# Patient Record
Sex: Female | Born: 1990 | Race: White | Hispanic: No | Marital: Married | Smoking: Never smoker
Health system: Southern US, Community
[De-identification: ages and names within clinical notes are randomized; demographics above are authoritative.]

## PROBLEM LIST (undated history)

## (undated) DIAGNOSIS — M545 Low back pain, unspecified: Secondary | ICD-10-CM

## (undated) DIAGNOSIS — E282 Polycystic ovarian syndrome: Secondary | ICD-10-CM

## (undated) DIAGNOSIS — F411 Generalized anxiety disorder: Secondary | ICD-10-CM

## (undated) HISTORY — DX: Generalized anxiety disorder: F41.1

## (undated) HISTORY — DX: Low back pain, unspecified: M54.50

## (undated) HISTORY — DX: Polycystic ovarian syndrome: E28.2

## (undated) HISTORY — DX: Low back pain: M54.5

---
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
## 2010-01-26 ENCOUNTER — Ambulatory Visit
Admission: RE | Admit: 2010-01-26 | Discharge: 2010-01-26 | Payer: Self-pay | Source: Home / Self Care | Attending: Internal Medicine | Admitting: Internal Medicine

## 2010-01-26 ENCOUNTER — Encounter: Payer: Self-pay | Admitting: Internal Medicine

## 2010-01-26 DIAGNOSIS — F411 Generalized anxiety disorder: Secondary | ICD-10-CM

## 2010-01-26 DIAGNOSIS — L738 Other specified follicular disorders: Secondary | ICD-10-CM | POA: Insufficient documentation

## 2010-01-26 HISTORY — DX: Generalized anxiety disorder: F41.1
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
## 2010-02-08 NOTE — Letter (Signed)
Summary: Out of Work  [facility]  [address] [place], [place] [address]   Phone: [phone]  Fax: [phone]    January 26, 2010   Employee:  ZI SEK    To Whom It May Concern:   For Medical reasons, please excuse the above named employee from work for the following dates:  01/26/2010  If you need additional information, please feel free to contact our office.         Sincerely,    Lamar Sprinkles, [facility]) for Dr Karie Schwalbe. Yetta Barre

## 2010-02-28 ENCOUNTER — Encounter: Payer: Self-pay | Admitting: Internal Medicine

## 2010-02-28 ENCOUNTER — Ambulatory Visit (INDEPENDENT_AMBULATORY_CARE_PROVIDER_SITE_OTHER): Payer: BC Managed Care – PPO | Admitting: Internal Medicine

## 2010-02-28 DIAGNOSIS — K5289 Other specified noninfective gastroenteritis and colitis: Secondary | ICD-10-CM
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
## 2010-03-06 NOTE — Letter (Signed)
Summary: Out of Work  [facility]  [address] [place], [place] [address]   Phone: [phone]  Fax: [phone]    February 28, 2010   Employee:  Barbara Bray    To Whom It May Concern:   For Medical reasons, please excuse the above named employee from work for the following dates:  Start:   02/28/10  End:   03/02/10  If you need additional information, please feel free to contact our office.         Sincerely,    Etta Grandchild MD

## 2010-05-13 ENCOUNTER — Emergency Department (HOSPITAL_COMMUNITY)
Admission: EM | Admit: 2010-05-13 | Discharge: 2010-05-13 | Disposition: A | Discharge status: Home / Self Care | Payer: BC Managed Care – PPO | Attending: Emergency Medicine | Admitting: Emergency Medicine

## 2010-05-13 DIAGNOSIS — J3489 Other specified disorders of nose and nasal sinuses: Secondary | ICD-10-CM | POA: Insufficient documentation

## 2010-05-13 DIAGNOSIS — H9209 Otalgia, unspecified ear: Secondary | ICD-10-CM | POA: Insufficient documentation

## 2010-05-21 ENCOUNTER — Ambulatory Visit (INDEPENDENT_AMBULATORY_CARE_PROVIDER_SITE_OTHER): Payer: BC Managed Care – PPO | Admitting: Internal Medicine

## 2010-05-21 ENCOUNTER — Encounter: Payer: Self-pay | Admitting: Internal Medicine

## 2010-05-21 VITALS — BP 100/70 | HR 114 | Temp 98.1°F | Ht 63.0 in

## 2010-05-21 DIAGNOSIS — H109 Unspecified conjunctivitis: Secondary | ICD-10-CM

## 2010-05-21 MED ORDER — POLYMYXIN B-TRIMETHOPRIM 10000-0.1 UNIT/ML-% OP SOLN: 1.0000 [drp] | OPHTHALMIC | Status: AC
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
## 2010-05-21 NOTE — Patient Instructions (Signed)
It was good to see you today. Polytrim antibiotics drops - Your prescription(s) have been submitted to your pharmacy. Please take as directed and contact our office if you believe you are having problem(s) with the medication(s). Wash bed sheets/pillow cases, and discard contacts or any eye makeup you have used since becoming ill with this eye irritation as discussed Followup as needed, call if symptoms worse
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
## 2010-05-25 NOTE — H&P (Signed)
Barbara Bray, Barbara Bray NO.:  [id number]   MEDICAL RECORD NO.:  [id number]                   PATIENT TYPE:  INP   LOCATION:  0103                                 FACILITY:  [facility]   PHYSICIAN:  Beverly Milch, MD                  DATE OF BIRTH:  [birth year]   DATE OF ADMISSION:  09/20/2003  DATE OF DISCHARGE:                         PSYCHIATRIC ADMISSION ASSESSMENT   IDENTIFICATION:  A 20 year old female, 8th grade student at [employer], is admitted emergently, involuntarily on a [facility] petition  for commitment in transfer from [facility]  Room for inpatient stabilization of suicide and homicidal risk.  The patient  was assessed by Dr. Jaci Lazier and determined to require inpatient  treatment and stabilization for safety.  Mother and the patient brought the  patient's journal containing writings about her 36 year old boyfriend Barbara Bray  as well as the patient having written a suicide note recently.  She states  she has a plan for suicide but will not disclose.   HISTORY OF PRESENT ILLNESS:  The patient is confusing, with some pressured  speech, as she reviews multiple contributing factors to her current  decompensation as well as her mounting psychological dysfunction over time.  The patient presents in a manic style, having expansive disregard for the  consequences of her relationship with a 78 year old man.  He has apparently  just been bailed out of jail.  She has a 32 year old friend, Barbara Bray, who  has been dating a middle-aged man as well, named Charles.  The patient seems  to have some identification  with Barbara Bray in this regard.  However the  patient's description of love for this 20 year old man is inherent to the  patient herself.  The patient has been gradually alienating herself from  mother and alienating mother somewhat from herself.  Parents are divorced  and the patient has had a  sister who attempted suicide twice in the past.  Paternal grandfather completed suicide.  The patient denies any sexual  activity.  She reports she is just in love with this 20 year old man and  that he will marry her when she turns age 77.  The patient reported to  mother she will have to be put away as she is suicidal over this man.  However she reported that she would not talk or participate in treatment on  arrival as though she did not maintain consistency with her statement that  she needed to be put away.  She has reported that she will kill anyone who  harms her 36 year old boyfriend.  The patient is expansive and grandiose  including in her threats.  Her mood is inappropriate to circumstance.  She  has no remorse and has significant denial for the consequences.  The patient  was in therapy at [facility] 5 to 7 months ago with Almond Lint, up until a  year ago.  The patient had a fear of sleeping alone, having to sleep in  mother's bed or out in the hall.  Her parents are divorced.  She describes  traumatic, chaotic, strange dreams that apparently contributed to not being  able to sleep alone.  However she does not acknowledge general anxiety or  specific anxiety.  She does not manifest social anxiety nor does she have  panic attacks.  She does not use alcohol or illicit drugs.  She denies  hallucinations or delusions.  She will not discuss mood swings or whether  she has had significant depression, although she has had episodic and  recurrent suicidal ideation.  She is post pubertal.  Her multiplicity of  symptoms raises questions for post-traumatic stress or oppositional-  defiance.  She may also have impulse control difficulties.   PAST MEDICAL HISTORY:  The patient had her last menses end of October 2004.  She denies sexual activity.  She suggests she has had 12 dental extractions  in the past.  She is not more specific about how or why.  She has no  medication allergies.  She  is on no medications at the time of admission.  She has no history of seizure or syncope.  She has no heart murmur or  arrythmia.   REVIEW OF SYSTEMS:  The patient denies any difficulty with gait, gaze or  continence.  She  denies exposure to communicable disease or toxins.  She  denies rash, jaundice or purpura.  There is no chest pain, palpitations, or  presyncope.  There is no abdominal pain, nausea, vomiting or diarrhea.  There is no dysuria or arthralgia.  Immunizations are up to date.   FAMILY HISTORY:  Parents are divorced.  The patient has a sister who  attempted suicide twice.  Paternal grandfather completed suicide.  A great  grandmother is supportive.  The patient has been progressively conflictual  with mother.   SOCIAL AND DEVELOPMENTAL HISTORY:  The patient is in the 8th grade at  [employer] and does well academically, making A's, B's and C's.  She denies any definite past sexual or physical maltreatment.  She denies  using cigarettes, alcohol, or illicit drugs.  However she manifests  Hysteroid denial as well as histrionic over elaboration regarding her  relationship with a 4 year old man.  She has his picture in her notebook  and wants that picture and notebook to have with her.  She acknowledges that  she is obsessed with him.  However she does not acknowledge sexual impulses  or obsessions.   ASSETS:  The patient seems intelligent.   MENTAL STATUS EXAM:  Height is 61 inches and weight is 112.5 pounds, with  blood pressure 106/70 with heart rate of 80 sitting and 103/70 with heart  rate of 112 standing.  The patient has an intact neurological exam.  She has  had no organic central nervous system trauma.  AMRs are intact and cranial  nerves are intact.  Speech is normal except pressured.  She is somewhat  labile in mood but overall expansive and euphoric.  She has no pathologic reflexes or soft neurologic findings.  Muscle strength and tone are normal.   There are no abnormal involuntary movements.  Gait and gaze are intact.  She  is expansive in her identifications and associations.  She describes  episodic dysphoria and suicidal ideation.  She has grandiose homicidal  ideation.  She is currently exhibiting moderate to severe euphoria,  particularly  for circumstances.  She is expansive and grandiose.  She is  able to participate in therapy, however judgment and insight are currently  poor.  She has no overt hallucinations or delusions, though question of  either shared delusions or grandiose delusions must be undertaken.  Dissociative symptoms are not definitely apparent but must be in the  differential diagnosis.  Suicidal ideation is more prominent than homicidal  ideation.   IMPRESSION:  AXIS 1:  1.  Mood disorder not otherwise specified, currently with manic symptoms.  2.  Identity disorder with hysteroid features.  3.  Rule out shared psychotic disorder (provisional diagnosis).  4.  Rule out post-traumatic stress disorder with reenactment symptoms      (provisional diagnosis).  5.  Rule out oppositional-defiant disorder (provisional diagnosis).  6.  Rule out impulse control disorder not otherwise specified (provisional      diagnosis).  AXIS II:  Diagnosis deferred.  AXIS III:  History of dental problems.  AXIS IV:  Stressors:  Family - severe, acute and chronic; peer relations - extreme,  acute; phase of life - extreme, acute.  AXIS V:  Global assessment of function of 40 with highest in last year 78.   PLAN:  The patient is admitted for inpatient adolescent psychiatric and  multi-disciplinary, multi-modal behavioral health treatment in a team-based  program in a locked psychiatric unit.  We will monitor mood, cognition and  activity for associated elaboration on dynamics and target symptoms  identification.  We will confront and clarify relations and past  connections, including with nightmares.  Psychoeducation, anger  management,  impulse control, behavioral containment, family therapy and parent  management training are planned, along with identity consolidation and  individuation and separation issues.  Cognitive behavioral therapy formats  are planned.  We will  consider Seroquel pharmacotherapy.  Estimated length of stay is 6-8 days  with target symptoms for discharge being stabilization of suicide and  homicidal risk, stabilization of mood, clarification of anxiety or  delusions, and generalization of the capacity for safe and effective  participation compliantly with outpatient care.                                               Beverly Milch, MD    GJ/MEDQ  D:  09/20/2003  T:  09/20/2003  Job:  [id number]

## 2010-05-31 ENCOUNTER — Ambulatory Visit: Payer: Self-pay | Admitting: Internal Medicine

## 2010-08-22 ENCOUNTER — Emergency Department (HOSPITAL_COMMUNITY)
Admission: EM | Admit: 2010-08-22 | Discharge: 2010-08-22 | Discharge status: Against Medical Advice | Payer: Self-pay | Attending: Emergency Medicine | Admitting: Emergency Medicine

## 2010-08-22 DIAGNOSIS — H5710 Ocular pain, unspecified eye: Secondary | ICD-10-CM | POA: Insufficient documentation

## 2011-01-29 ENCOUNTER — Other Ambulatory Visit: Payer: Self-pay | Admitting: Internal Medicine

## 2011-01-29 NOTE — Telephone Encounter (Signed)
Needs to be seen

## 2011-02-01 ENCOUNTER — Ambulatory Visit: Payer: Self-pay | Admitting: Internal Medicine

## 2011-02-01 DIAGNOSIS — Z0289 Encounter for other administrative examinations: Secondary | ICD-10-CM

## 2011-02-18 ENCOUNTER — Ambulatory Visit: Payer: Self-pay | Admitting: Internal Medicine

## 2011-02-18 DIAGNOSIS — Z0289 Encounter for other administrative examinations: Secondary | ICD-10-CM

## 2011-02-28 ENCOUNTER — Encounter (HOSPITAL_COMMUNITY): Payer: Self-pay | Admitting: Emergency Medicine

## 2011-02-28 ENCOUNTER — Emergency Department (HOSPITAL_COMMUNITY)
Admission: EM | Admit: 2011-02-28 | Discharge: 2011-02-28 | Disposition: A | Discharge status: Home / Self Care | Payer: BC Managed Care – PPO | Attending: Emergency Medicine | Admitting: Emergency Medicine

## 2011-02-28 DIAGNOSIS — M549 Dorsalgia, unspecified: Secondary | ICD-10-CM | POA: Insufficient documentation

## 2011-02-28 DIAGNOSIS — N949 Unspecified condition associated with female genital organs and menstrual cycle: Secondary | ICD-10-CM | POA: Insufficient documentation

## 2011-02-28 DIAGNOSIS — R319 Hematuria, unspecified: Secondary | ICD-10-CM | POA: Insufficient documentation

## 2011-02-28 DIAGNOSIS — R109 Unspecified abdominal pain: Secondary | ICD-10-CM | POA: Insufficient documentation

## 2011-02-28 DIAGNOSIS — N390 Urinary tract infection, site not specified: Secondary | ICD-10-CM | POA: Insufficient documentation

## 2011-02-28 DIAGNOSIS — R350 Frequency of micturition: Secondary | ICD-10-CM | POA: Insufficient documentation

## 2011-02-28 LAB — URINALYSIS, ROUTINE W REFLEX MICROSCOPIC
Glucose, UA: NEGATIVE mg/dL
Protein, ur: >300 mg/dL — AB
Urobilinogen, UA: 1 mg/dL (ref 0.0–1.0)

## 2011-02-28 LAB — WET PREP, GENITAL
WBC, Wet Prep HPF POC: NONE SEEN
Yeast Wet Prep HPF POC: NONE SEEN

## 2011-02-28 LAB — URINE MICROSCOPIC-ADD ON

## 2011-02-28 LAB — POCT PREGNANCY, URINE: Preg Test, Ur: NEGATIVE

## 2011-02-28 MED ORDER — PHENAZOPYRIDINE HCL 100 MG PO TABS
100.0000 mg | ORAL_TABLET | Freq: Three times a day (TID) | ORAL | Status: DC
  Administered 2011-02-28: 100 mg via ORAL

## 2011-02-28 MED ORDER — SULFAMETHOXAZOLE-TMP DS 800-160 MG PO TABS: 1.0000 | ORAL_TABLET | Freq: Two times a day (BID) | ORAL | Status: AC

## 2011-02-28 MED ORDER — SULFAMETHOXAZOLE-TMP DS 800-160 MG PO TABS
1.0000 | ORAL_TABLET | Freq: Once | ORAL | Status: AC
  Administered 2011-02-28: 1 via ORAL
  Filled 2011-02-28: qty 1

## 2011-02-28 MED ORDER — PHENAZOPYRIDINE HCL 200 MG PO TABS
ORAL_TABLET | ORAL | Status: AC
  Administered 2011-02-28: 100 mg via ORAL
  Filled 2011-02-28: qty 1

## 2011-02-28 MED ORDER — PHENAZOPYRIDINE HCL 100 MG PO TABS: 100.0000 mg | ORAL_TABLET | Freq: Three times a day (TID) | ORAL | Status: AC
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
## 2011-02-28 NOTE — Discharge Instructions (Signed)

## 2011-02-28 NOTE — ED Provider Notes (Signed)
Medical screening examination/treatment/procedure(s) were performed by non-physician practitioner and as supervising physician I was immediately available for consultation/collaboration.     Loren Racer, MD 02/28/11 [phone]

## 2011-02-28 NOTE — ED Notes (Signed)
Pt presented to the Er with c/o lower abd pain, intermittent, 6/10, RLQ and radiates to the lowe back, pt state that when urinating she is experiencing burning sensation, constant pain, 10/10

## 2011-03-01 LAB — GC/CHLAMYDIA PROBE AMP, GENITAL: Chlamydia, DNA Probe: NEGATIVE

## 2011-03-14 ENCOUNTER — Ambulatory Visit: Payer: Self-pay | Admitting: Internal Medicine

## 2011-03-18 ENCOUNTER — Ambulatory Visit: Payer: Self-pay | Admitting: Internal Medicine

## 2011-03-18 DIAGNOSIS — Z0289 Encounter for other administrative examinations: Secondary | ICD-10-CM

## 2011-04-11 ENCOUNTER — Encounter: Payer: Self-pay | Admitting: Internal Medicine

## 2011-04-11 ENCOUNTER — Ambulatory Visit (INDEPENDENT_AMBULATORY_CARE_PROVIDER_SITE_OTHER): Payer: BC Managed Care – PPO | Admitting: Internal Medicine

## 2011-04-11 VITALS — BP 110/72 | HR 81 | Temp 98.3°F | Resp 16 | Ht 63.0 in | Wt 124.0 lb

## 2011-04-11 DIAGNOSIS — J45909 Unspecified asthma, uncomplicated: Secondary | ICD-10-CM

## 2011-04-11 DIAGNOSIS — F411 Generalized anxiety disorder: Secondary | ICD-10-CM

## 2011-04-11 DIAGNOSIS — J4530 Mild persistent asthma, uncomplicated: Secondary | ICD-10-CM

## 2011-04-11 MED ORDER — MOMETASONE FURO-FORMOTEROL FUM 100-5 MCG/ACT IN AERO: 2.0000 | INHALATION_SPRAY | Freq: Two times a day (BID) | RESPIRATORY_TRACT | Status: DC

## 2011-04-11 MED ORDER — CLONAZEPAM 1 MG PO TABS: 1.0000 mg | ORAL_TABLET | Freq: Two times a day (BID) | ORAL | Status: DC | PRN

## 2011-04-11 MED ORDER — ALBUTEROL SULFATE HFA 108 (90 BASE) MCG/ACT IN AERS: 2.0000 | INHALATION_SPRAY | Freq: Four times a day (QID) | RESPIRATORY_TRACT | Status: DC | PRN

## 2011-04-11 MED ORDER — ESCITALOPRAM OXALATE 10 MG PO TABS: 10.0000 mg | ORAL_TABLET | Freq: Every day | ORAL | Status: DC

## 2011-04-11 NOTE — Progress Notes (Signed)
Subjective:    Patient ID: Barbara Bray, female    DOB: 08/20/1990, 21 y.o.   MRN: [id number]  Anxiety Presents for follow-up visit. Symptoms include excessive worry, hyperventilation, insomnia, nervous/anxious behavior and panic. Patient reports no chest pain, compulsions, confusion, decreased concentration, depressed mood, dizziness, dry mouth, feeling of choking, irritability, malaise, muscle tension, nausea, obsessions, palpitations, restlessness, shortness of breath or suicidal ideas. Symptoms occur occasionally. The severity of symptoms is mild. The quality of sleep is fair. Nighttime awakenings: occasional.   Her past medical history is significant for asthma. Compliance with medications is 0-25%.  Asthma She complains of wheezing. There is no chest tightness, cough, difficulty breathing, frequent throat clearing, hemoptysis, hoarse voice, shortness of breath or sputum production. This is a recurrent problem. The current episode started more than 1 year ago. The problem occurs intermittently. The problem has been unchanged. Pertinent negatives include no appetite change, chest pain, dyspnea on exertion, ear congestion, ear pain, fever, headaches, heartburn, malaise/fatigue, myalgias, nasal congestion, orthopnea, PND, postnasal drip, rhinorrhea, sneezing, sore throat, sweats, trouble swallowing or weight loss. Her symptoms are aggravated by change in weather. Her symptoms are alleviated by beta-agonist. She reports minimal improvement on treatment. Her past medical history is significant for asthma.      Review of Systems  Constitutional: Negative for fever, weight loss, malaise/fatigue, diaphoresis, activity change, appetite change, irritability, fatigue and unexpected weight change.  HENT: Negative.  Negative for ear pain, sore throat, hoarse voice, rhinorrhea, sneezing, trouble swallowing and postnasal drip.   Eyes: Negative.   Respiratory: Positive for wheezing. Negative for apnea, cough,  hemoptysis, sputum production, choking, chest tightness, shortness of breath and stridor.   Cardiovascular: Negative for chest pain, dyspnea on exertion, palpitations, leg swelling and PND.  Gastrointestinal: Negative for heartburn, nausea, vomiting, abdominal pain, diarrhea, constipation, blood in stool, abdominal distention, anal bleeding and rectal pain.  Genitourinary: Negative for dysuria, urgency, frequency, hematuria, flank pain, decreased urine volume, enuresis, difficulty urinating and dyspareunia.  Musculoskeletal: Negative for myalgias, back pain, joint swelling, arthralgias and gait problem.  Skin: Negative for color change, pallor, rash and wound.  Neurological: Negative for dizziness, tremors, seizures, syncope, facial asymmetry, speech difficulty, weakness, light-headedness, numbness and headaches.  Hematological: Negative for adenopathy. Does not bruise/bleed easily.  Psychiatric/Behavioral: Negative for suicidal ideas, confusion and decreased concentration. The patient is nervous/anxious and has insomnia.        Objective:   Physical Exam  Vitals reviewed. Constitutional: She is oriented to person, place, and time. She appears well-developed and well-nourished. No distress.  HENT:  Head: Normocephalic and atraumatic.  Mouth/Throat: Oropharynx is clear and moist. No oropharyngeal exudate.  Eyes: Conjunctivae are normal. Right eye exhibits no discharge. Left eye exhibits no discharge. No scleral icterus.  Neck: Normal range of motion. Neck supple. No JVD present. No tracheal deviation present. No thyromegaly present.  Cardiovascular: Normal rate, regular rhythm, normal heart sounds and intact distal pulses.  Exam reveals no gallop and no friction rub.   No murmur heard. Pulmonary/Chest: Effort normal and breath sounds normal. No stridor. No respiratory distress. She has no wheezes. She has no rales. She exhibits no tenderness.  Abdominal: Soft. Bowel sounds are normal. She  exhibits no distension and no mass. There is no tenderness. There is no rebound and no guarding.  Musculoskeletal: Normal range of motion. She exhibits no edema and no tenderness.  Lymphadenopathy:    She has no cervical adenopathy.  Neurological: She is oriented to person, place, and time.  Skin: Skin is warm and dry. No rash noted. She is not diaphoretic. No erythema. No pallor.  Psychiatric: She has a normal mood and affect. Her behavior is normal. Judgment and thought content normal.          Assessment & Plan:

## 2011-04-11 NOTE — Assessment & Plan Note (Signed)
She will restart lexapro and will use klonopin as needed

## 2011-04-11 NOTE — Patient Instructions (Signed)
Asthma, Adult Asthma is caused by narrowing of the air passages in the lungs. It may be triggered by pollen, dust, animal dander, molds, some foods, respiratory infections, exposure to smoke, exercise, emotional stress or other allergens (things that cause allergic reactions or allergies). Repeat attacks are common. HOME CARE INSTRUCTIONS   Use prescription medications as ordered by your caregiver.   Avoid pollen, dust, animal dander, molds, smoke and other things that cause attacks at home and at work.   You may have fewer attacks if you decrease dust in your home. Electrostatic air cleaners may help.   It may help to replace your pillows or mattress with materials less likely to cause allergies.   Talk to your caregiver about an action plan for managing asthma attacks at home, including, the use of a peak flow meter which measures the severity of your asthma attack. An action plan can help minimize or stop the attack without having to seek medical care.   If you are not on a fluid restriction, drink 8 to 10 glasses of water each day.   Always have a plan prepared for seeking medical attention, including, calling your physician, accessing local emergency care, and calling 911 (in the [place] for a severe attack.   Discuss possible exercise routines with your caregiver.   If animal dander is the cause of asthma, you may need to get rid of pets.  SEEK MEDICAL CARE IF:   You have wheezing and shortness of breath even if taking medicine to prevent attacks.   You have muscle aches, chest pain or thickening of sputum.   Your sputum changes from clear or white to yellow, green, gray, or bloody.   You have any problems that may be related to the medicine you are taking (such as a rash, itching, swelling or trouble breathing).  SEEK IMMEDIATE MEDICAL CARE IF:   Your usual medicines do not stop your wheezing or there is increased coughing and/or shortness of breath.   You have increased  difficulty breathing.   You have a fever.  MAKE SURE YOU:   Understand these instructions.   Will watch your condition.   Will get help right away if you are not doing well or get worse.  Document Released: 12/24/2004 Document Revised: 12/13/2010 Document Reviewed: 08/12/2007 [facility] Patient Information 2012 [place], [place].Anxiety and Panic Attacks Your caregiver has informed you that you are having an anxiety or panic attack. There may be many forms of this. Most of the time these attacks come suddenly and without warning. They come at any time of day, including periods of sleep, and at any time of life. They may be strong and unexplained. Although panic attacks are very scary, they are physically harmless. Sometimes the cause of your anxiety is not known. Anxiety is a protective mechanism of the body in its fight or flight mechanism. Most of these perceived danger situations are actually nonphysical situations (such as anxiety over losing a job). CAUSES  The causes of an anxiety or panic attack are many. Panic attacks may occur in otherwise healthy people given a certain set of circumstances. There may be a genetic cause for panic attacks. Some medications may also have anxiety as a side effect. SYMPTOMS  Some of the most common feelings are:  Intense terror.   Dizziness, feeling faint.   Hot and cold flashes.   Fear of going crazy.   Feelings that nothing is real.   Sweating.   Shaking.   Chest pain or  a fast heartbeat (palpitations).   Smothering, choking sensations.   Feelings of impending doom and that death is near.   Tingling of extremities, this may be from over-breathing.   Altered reality (derealization).   Being detached from yourself (depersonalization).  Several symptoms can be present to make up anxiety or panic attacks. DIAGNOSIS  The evaluation by your caregiver will depend on the type of symptoms you are experiencing. The diagnosis of anxiety or panic  attack is made when no physical illness can be determined to be a cause of the symptoms. TREATMENT  Treatment to prevent anxiety and panic attacks may include:  Avoidance of circumstances that cause anxiety.   Reassurance and relaxation.   Regular exercise.   Relaxation therapies, such as yoga.   Psychotherapy with a psychiatrist or therapist.   Avoidance of caffeine, alcohol and illegal drugs.   Prescribed medication.  SEEK IMMEDIATE MEDICAL CARE IF:   You experience panic attack symptoms that are different than your usual symptoms.   You have any worsening or concerning symptoms.  Document Released: 12/24/2004 Document Revised: 12/13/2010 Document Reviewed: 04/27/2009 [facility] Patient Information 2012 [place], [place].

## 2011-04-11 NOTE — Assessment & Plan Note (Signed)
I think she needs to add on an ICS so I started her on Dulera, she can continue with albuterol as needed

## 2011-04-12 ENCOUNTER — Ambulatory Visit (INDEPENDENT_AMBULATORY_CARE_PROVIDER_SITE_OTHER): Payer: BC Managed Care – PPO

## 2011-04-12 DIAGNOSIS — Z23 Encounter for immunization: Secondary | ICD-10-CM

## 2011-05-01 ENCOUNTER — Ambulatory Visit (INDEPENDENT_AMBULATORY_CARE_PROVIDER_SITE_OTHER): Payer: BC Managed Care – PPO | Admitting: Internal Medicine

## 2011-05-01 ENCOUNTER — Encounter: Payer: Self-pay | Admitting: Internal Medicine

## 2011-05-01 ENCOUNTER — Other Ambulatory Visit (INDEPENDENT_AMBULATORY_CARE_PROVIDER_SITE_OTHER): Payer: BC Managed Care – PPO

## 2011-05-01 VITALS — BP 114/84 | HR 96 | Temp 98.1°F | Resp 16 | Wt 122.5 lb

## 2011-05-01 DIAGNOSIS — N390 Urinary tract infection, site not specified: Secondary | ICD-10-CM

## 2011-05-01 LAB — URINALYSIS, ROUTINE W REFLEX MICROSCOPIC
Nitrite: POSITIVE
Specific Gravity, Urine: 1.02 (ref 1.000–1.030)
Total Protein, Urine: NEGATIVE
Urobilinogen, UA: 0.2 (ref 0.0–1.0)

## 2011-05-01 LAB — POCT URINALYSIS DIPSTICK
Bilirubin, UA: NEGATIVE
Glucose, UA: NEGATIVE
Nitrite, UA: NEGATIVE
Spec Grav, UA: <=1.005
Urobilinogen, UA: 0.2

## 2011-05-01 MED ORDER — NITROFURANTOIN MONOHYD MACRO 100 MG PO CAPS: 100.0000 mg | ORAL_CAPSULE | Freq: Two times a day (BID) | ORAL | Status: AC

## 2011-05-01 MED ORDER — PHENAZOPYRIDINE HCL 200 MG PO TABS: 200.0000 mg | ORAL_TABLET | Freq: Three times a day (TID) | ORAL | Status: AC | PRN

## 2011-05-01 NOTE — Assessment & Plan Note (Signed)
Start macrobid and pyridium and check her urine culture today

## 2011-05-01 NOTE — Patient Instructions (Signed)

## 2011-05-01 NOTE — Progress Notes (Signed)
  Subjective:    Patient ID: Barbara Bray, female    DOB: [birth year], 21 y.o.   MRN: [id number]  Dysuria  This is a new problem. The current episode started yesterday. The problem occurs every urination. The problem has been gradually worsening. The quality of the pain is described as burning. The pain is at a severity of 0/10. The patient is experiencing no pain. There has been no fever. The fever has been present for less than 1 day. She is sexually active. There is no history of pyelonephritis. Associated symptoms include frequency, hematuria and urgency. Pertinent negatives include no chills, discharge, flank pain, hesitancy, nausea, possible pregnancy, sweats or vomiting. She has tried nothing for the symptoms. Her past medical history is significant for recurrent UTIs.      Review of Systems  Constitutional: Negative for fever, chills, diaphoresis, activity change, appetite change, fatigue and unexpected weight change.  HENT: Negative.   Eyes: Negative.   Respiratory: Negative for cough, chest tightness, shortness of breath, wheezing and stridor.   Cardiovascular: Negative for chest pain, palpitations and leg swelling.  Gastrointestinal: Negative for nausea, vomiting, abdominal pain, diarrhea and blood in stool.  Genitourinary: Positive for dysuria, urgency, frequency and hematuria. Negative for hesitancy, flank pain, decreased urine volume, vaginal bleeding, vaginal discharge, enuresis, difficulty urinating, genital sores, vaginal pain, menstrual problem, pelvic pain and dyspareunia.  Musculoskeletal: Negative for myalgias, back pain, joint swelling, arthralgias and gait problem.  Skin: Negative for color change, pallor, rash and wound.  Neurological: Negative.   Hematological: Negative for adenopathy. Does not bruise/bleed easily.  Psychiatric/Behavioral: Negative.        Objective:   Physical Exam  Vitals reviewed. Constitutional: She is oriented to person, place, and time. She  appears well-developed and well-nourished. No distress.  HENT:  Head: Normocephalic and atraumatic.  Mouth/Throat: Oropharynx is clear and moist. No oropharyngeal exudate.  Eyes: Conjunctivae are normal. Right eye exhibits no discharge. Left eye exhibits no discharge. No scleral icterus.  Neck: Normal range of motion. Neck supple. No JVD present. No tracheal deviation present. No thyromegaly present.  Cardiovascular: Normal rate, regular rhythm, normal heart sounds and intact distal pulses.  Exam reveals no gallop and no friction rub.   No murmur heard. Pulmonary/Chest: Effort normal and breath sounds normal. No stridor. No respiratory distress. She has no wheezes. She has no rales. She exhibits no tenderness.  Abdominal: Soft. Normal appearance and bowel sounds are normal. She exhibits no shifting dullness, no distension, no pulsatile liver, no fluid wave, no abdominal bruit, no ascites, no pulsatile midline mass and no mass. There is no hepatosplenomegaly. There is no tenderness. There is no rigidity, no rebound, no guarding, no CVA tenderness, no tenderness at McBurney's point and negative Murphy's sign.  Musculoskeletal: Normal range of motion. She exhibits no edema and no tenderness.  Lymphadenopathy:    She has no cervical adenopathy.  Neurological: She is oriented to person, place, and time.  Skin: Skin is warm and dry. No rash noted. She is not diaphoretic. No erythema. No pallor.  Psychiatric: She has a normal mood and affect. Her behavior is normal. Judgment and thought content normal.          Assessment & Plan:

## 2011-05-02 ENCOUNTER — Encounter: Payer: Self-pay | Admitting: Internal Medicine

## 2011-05-28 ENCOUNTER — Ambulatory Visit: Payer: Self-pay | Admitting: Internal Medicine

## 2011-05-28 DIAGNOSIS — Z0289 Encounter for other administrative examinations: Secondary | ICD-10-CM

## 2011-06-27 ENCOUNTER — Ambulatory Visit: Payer: Self-pay | Admitting: Internal Medicine

## 2011-06-27 DIAGNOSIS — Z0289 Encounter for other administrative examinations: Secondary | ICD-10-CM

## 2011-07-24 ENCOUNTER — Ambulatory Visit (INDEPENDENT_AMBULATORY_CARE_PROVIDER_SITE_OTHER): Payer: BC Managed Care – PPO | Admitting: Internal Medicine

## 2011-07-24 ENCOUNTER — Encounter: Payer: Self-pay | Admitting: Internal Medicine

## 2011-07-24 VITALS — BP 116/80 | HR 88 | Temp 98.1°F | Resp 16 | Wt 126.0 lb

## 2011-07-24 DIAGNOSIS — Z23 Encounter for immunization: Secondary | ICD-10-CM

## 2011-07-24 DIAGNOSIS — F411 Generalized anxiety disorder: Secondary | ICD-10-CM

## 2011-07-24 MED ORDER — CLONAZEPAM 1 MG PO TABS: 1.0000 mg | ORAL_TABLET | Freq: Two times a day (BID) | ORAL | Status: DC | PRN

## 2011-07-24 NOTE — Assessment & Plan Note (Signed)
She is doing well on the current meds and will continue them

## 2011-07-24 NOTE — Patient Instructions (Signed)

## 2011-07-24 NOTE — Progress Notes (Signed)
  Subjective:    Patient ID: Barbara Bray, female    DOB: [birth year], 20 y.o.   MRN: [id number]  Anxiety Presents for follow-up visit. Symptoms include insomnia, irritability and nervous/anxious behavior. Patient reports no chest pain, compulsions, confusion, decreased concentration, depressed mood, dizziness, dry mouth, excessive worry, feeling of choking, hyperventilation, malaise, muscle tension, nausea, obsessions, palpitations, panic, restlessness, shortness of breath or suicidal ideas. Symptoms occur most days. The severity of symptoms is mild. The quality of sleep is fair. Nighttime awakenings: occasional.   Compliance with medications is 76-100%.      Review of Systems  Constitutional: Positive for irritability.  HENT: Negative.   Eyes: Negative.   Respiratory: Negative for chest tightness, shortness of breath, wheezing and stridor.   Cardiovascular: Negative.  Negative for chest pain and palpitations.  Gastrointestinal: Negative.  Negative for nausea, vomiting, abdominal pain, diarrhea, constipation and blood in stool.  Genitourinary: Negative for dysuria, urgency, frequency, hematuria, flank pain, decreased urine volume, enuresis, difficulty urinating and dyspareunia.  Musculoskeletal: Negative.   Skin: Negative.   Neurological: Negative.  Negative for dizziness.  Hematological: Negative for adenopathy. Does not bruise/bleed easily.  Psychiatric/Behavioral: Negative for suicidal ideas, hallucinations, behavioral problems, confusion, disturbed wake/sleep cycle, self-injury, dysphoric mood, decreased concentration and agitation. The patient is nervous/anxious and has insomnia. The patient is not hyperactive.        Objective:   Physical Exam  Vitals reviewed. Constitutional: She is oriented to person, place, and time. She appears well-developed and well-nourished. No distress.  HENT:  Head: Normocephalic and atraumatic.  Mouth/Throat: Oropharynx is clear and moist. No  oropharyngeal exudate.  Eyes: Conjunctivae are normal. Right eye exhibits no discharge. Left eye exhibits no discharge. No scleral icterus.  Neck: Normal range of motion. Neck supple. No JVD present. No tracheal deviation present. No thyromegaly present.  Cardiovascular: Normal rate, regular rhythm, normal heart sounds and intact distal pulses.  Exam reveals no gallop and no friction rub.   No murmur heard. Pulmonary/Chest: Effort normal and breath sounds normal. No stridor. No respiratory distress. She has no wheezes. She has no rales. She exhibits no tenderness.  Abdominal: Soft. Bowel sounds are normal. She exhibits no distension and no mass. There is no tenderness. There is no rebound and no guarding.  Musculoskeletal: Normal range of motion. She exhibits no edema and no tenderness.  Lymphadenopathy:    She has no cervical adenopathy.  Neurological: She is oriented to person, place, and time.  Skin: Skin is warm and dry. No rash noted. She is not diaphoretic. No erythema. No pallor.  Psychiatric: She has a normal mood and affect. Her behavior is normal. Judgment and thought content normal.          Assessment & Plan:

## 2011-07-26 LAB — TB SKIN TEST
Induration: 0 mm
TB Skin Test: NEGATIVE

## 2011-09-10 ENCOUNTER — Ambulatory Visit (INDEPENDENT_AMBULATORY_CARE_PROVIDER_SITE_OTHER): Payer: BC Managed Care – PPO | Admitting: Endocrinology

## 2011-09-10 ENCOUNTER — Encounter: Payer: Self-pay | Admitting: Endocrinology

## 2011-09-10 VITALS — BP 112/80 | HR 93 | Temp 98.1°F | Resp 14 | Wt 127.5 lb

## 2011-09-10 DIAGNOSIS — L409 Psoriasis, unspecified: Secondary | ICD-10-CM

## 2011-09-10 DIAGNOSIS — L408 Other psoriasis: Secondary | ICD-10-CM

## 2011-09-10 MED ORDER — HYDROCORTISONE 2.5 % EX LOTN: TOPICAL_LOTION | Freq: Three times a day (TID) | CUTANEOUS | Status: DC

## 2011-09-10 MED ORDER — CEFUROXIME AXETIL 250 MG PO TABS: 250.0000 mg | ORAL_TABLET | Freq: Two times a day (BID) | ORAL | Status: DC

## 2011-09-10 MED ORDER — TRAMADOL HCL 50 MG PO TABS: 50.0000 mg | ORAL_TABLET | Freq: Four times a day (QID) | ORAL | Status: DC | PRN
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
## 2011-09-10 NOTE — Patient Instructions (Addendum)
i have sent 3 prescriptions to your pharmacy; for an antibiotic, skin lotion, and cough medication. I hope you feel better soon.  If you don't feel better by next week, please call back. Refer to a dermatology specialist.  you will receive a phone call, about a day and time for an appointment

## 2011-09-11 ENCOUNTER — Telehealth: Payer: Self-pay | Admitting: Internal Medicine

## 2011-09-11 MED ORDER — HYDROCODONE-HOMATROPINE 5-1.5 MG/5ML PO SYRP: 5.0000 mL | ORAL_SOLUTION | Freq: Three times a day (TID) | ORAL | Status: AC | PRN

## 2011-09-11 NOTE — Telephone Encounter (Signed)
Please call into [facility] on [address]

## 2011-09-11 NOTE — Telephone Encounter (Signed)
Pt saw Dr Everardo All yesterday and states that she was prescribed tramadol, request something else be called in. meds gave her a stomach ache and made her ill. Did not help cough

## 2011-09-16 ENCOUNTER — Encounter: Payer: Self-pay | Admitting: Internal Medicine

## 2011-09-16 ENCOUNTER — Ambulatory Visit (INDEPENDENT_AMBULATORY_CARE_PROVIDER_SITE_OTHER): Payer: BC Managed Care – PPO | Admitting: Internal Medicine

## 2011-09-16 ENCOUNTER — Encounter: Payer: Self-pay | Admitting: Endocrinology

## 2011-09-16 ENCOUNTER — Ambulatory Visit (INDEPENDENT_AMBULATORY_CARE_PROVIDER_SITE_OTHER)
Admission: RE | Admit: 2011-09-16 | Discharge: 2011-09-16 | Disposition: A | Discharge status: Home / Self Care | Payer: BC Managed Care – PPO | Source: Ambulatory Visit | Attending: Internal Medicine | Admitting: Internal Medicine

## 2011-09-16 VITALS — BP 108/80 | HR 88 | Temp 97.7°F | Resp 16 | Wt 132.0 lb

## 2011-09-16 DIAGNOSIS — J45909 Unspecified asthma, uncomplicated: Secondary | ICD-10-CM

## 2011-09-16 DIAGNOSIS — R05 Cough: Secondary | ICD-10-CM

## 2011-09-16 DIAGNOSIS — J4530 Mild persistent asthma, uncomplicated: Secondary | ICD-10-CM

## 2011-09-16 DIAGNOSIS — J209 Acute bronchitis, unspecified: Secondary | ICD-10-CM

## 2011-09-16 DIAGNOSIS — R059 Cough, unspecified: Secondary | ICD-10-CM

## 2011-09-16 MED ORDER — METHYLPREDNISOLONE 4 MG PO KIT: PACK | ORAL | Status: AC

## 2011-09-16 MED ORDER — HYDROCOD POLST-CPM POLST ER 10-8 MG PO CP12: 1.0000 | ORAL_CAPSULE | Freq: Two times a day (BID) | ORAL | Status: DC | PRN

## 2011-09-16 MED ORDER — AZITHROMYCIN 500 MG PO TABS: 500.0000 mg | ORAL_TABLET | Freq: Every day | ORAL | Status: AC

## 2011-09-16 NOTE — Patient Instructions (Signed)

## 2011-09-16 NOTE — Progress Notes (Signed)
  Subjective:    Patient ID: Barbara Bray, female    DOB: [birth year], 21 y.o.   MRN: [id number]  Cough This is a recurrent problem. The current episode started 1 to 4 weeks ago. The problem has been unchanged. The problem occurs every few hours. The cough is non-productive. Associated symptoms include rhinorrhea and wheezing. Pertinent negatives include no chest pain, chills, ear congestion, ear pain, fever, headaches, heartburn, hemoptysis, myalgias, nasal congestion, postnasal drip, rash, sore throat, shortness of breath, sweats or weight loss. She has tried OTC cough suppressant, prescription cough suppressant, steroid inhaler and a beta-agonist inhaler (ceftin, tramadol, hycodan) for the symptoms. Her past medical history is significant for asthma.      Review of Systems  Constitutional: Negative for fever, chills, weight loss, diaphoresis, activity change, appetite change, fatigue and unexpected weight change.  HENT: Positive for rhinorrhea. Negative for ear pain, nosebleeds, congestion, sore throat, sneezing, voice change and postnasal drip.   Eyes: Negative.   Respiratory: Positive for cough and wheezing. Negative for apnea, hemoptysis, choking, chest tightness, shortness of breath and stridor.   Cardiovascular: Negative for chest pain, palpitations and leg swelling.  Gastrointestinal: Negative.  Negative for heartburn.  Genitourinary: Negative.   Musculoskeletal: Negative for myalgias, back pain, joint swelling, arthralgias and gait problem.  Skin: Negative for color change, pallor, rash and wound.  Neurological: Negative.  Negative for headaches.  Hematological: Negative for adenopathy. Does not bruise/bleed easily.  Psychiatric/Behavioral: Negative.        Objective:   Physical Exam  Vitals reviewed. Constitutional: She is oriented to person, place, and time. She appears well-developed and well-nourished.  Non-toxic appearance. She does not have a sickly appearance. She does not  appear ill. No distress.  HENT:  Head: Normocephalic and atraumatic.  Mouth/Throat: Oropharynx is clear and moist. No oropharyngeal exudate.  Eyes: Conjunctivae are normal. Right eye exhibits no discharge. Left eye exhibits no discharge. No scleral icterus.  Neck: Normal range of motion. Neck supple. No JVD present. No tracheal deviation present. No thyromegaly present.  Cardiovascular: Normal rate, regular rhythm, normal heart sounds and intact distal pulses.  Exam reveals no gallop and no friction rub.   No murmur heard. Pulmonary/Chest: Effort normal and breath sounds normal. No stridor. No respiratory distress. She has no wheezes. She has no rales. She exhibits no tenderness.  Abdominal: Soft. Bowel sounds are normal. She exhibits no distension and no mass. There is no tenderness. There is no rebound and no guarding.  Musculoskeletal: Normal range of motion. She exhibits no edema and no tenderness.  Lymphadenopathy:    She has no cervical adenopathy.  Neurological: She is oriented to person, place, and time.  Skin: Skin is warm and dry. No rash noted. She is not diaphoretic. No erythema. No pallor.  Psychiatric: She has a normal mood and affect. Her behavior is normal. Judgment and thought content normal.          Assessment & Plan:

## 2011-09-16 NOTE — Assessment & Plan Note (Signed)
I will check her CXR to look for pna, mass, edema 

## 2011-09-16 NOTE — Assessment & Plan Note (Signed)
I will change her antibiotic to zpak to cover aytpicals and will try to control her cough with tussicaps

## 2011-09-16 NOTE — Assessment & Plan Note (Signed)
Will start a medrol dose pak to treat the flare of wheezing

## 2011-09-20 ENCOUNTER — Telehealth: Payer: Self-pay | Admitting: Internal Medicine

## 2011-09-20 NOTE — Telephone Encounter (Signed)
Caller: Barbara Bray/Patient; PCP: Sanda Linger (Adults only); CB#: [phone]; Call regarding Question About Whooping Cough, States she  Had A. Chest X-Ray That Came Back Normal, Wants To Know If There Is A. Change She Could Still Have Whooping Cough.; Caller states her sister just had a new baby and  is asking if she could still have whooping cough even with a normal chest xray  and be contagious.  Caller states she was in line at pharmacy when Dr. Yetta Barre called to tell her that her chest xray was normal and she did not think to ask any questions then.  Reviewed EPIC and chart note shows Dr. Yetta Barre is treating her for Acute Bronchitis, Cough and Asthma.  DISCUSSED WITH CALLER WILL SEND NOTE TO DR. Yetta Barre TO MAKE SURE HE IS NOT CONCERNED PATIENT MAY STILL HAVE WHOOPING COUGH WITH NORMAL XRAY.  Caller states she completed her three days worth of Azithromycin on 09/19/11 and has not noticed much change in cough at this time.  Discussed with caller antibiotic stays in system working for 10-14 days and should be seen next week if no significant improvement in symptoms or sooner for worsening symptoms. Caller verbalized understanding and agreement.

## 2011-11-27 ENCOUNTER — Ambulatory Visit: Payer: BC Managed Care – PPO | Admitting: Internal Medicine

## 2012-05-11 ENCOUNTER — Ambulatory Visit: Payer: Self-pay | Admitting: Gynecology

## 2012-06-09 ENCOUNTER — Other Ambulatory Visit (HOSPITAL_COMMUNITY)
Admission: RE | Admit: 2012-06-09 | Discharge: 2012-06-09 | Disposition: A | Discharge status: Home / Self Care | Payer: BC Managed Care – PPO | Source: Ambulatory Visit | Attending: Obstetrics and Gynecology | Admitting: Obstetrics and Gynecology

## 2012-06-09 ENCOUNTER — Other Ambulatory Visit: Payer: Self-pay | Admitting: Obstetrics and Gynecology

## 2012-06-09 DIAGNOSIS — Z01419 Encounter for gynecological examination (general) (routine) without abnormal findings: Secondary | ICD-10-CM | POA: Insufficient documentation

## 2012-06-09 DIAGNOSIS — Z113 Encounter for screening for infections with a predominantly sexual mode of transmission: Secondary | ICD-10-CM | POA: Insufficient documentation

## 2012-06-16 ENCOUNTER — Ambulatory Visit (INDEPENDENT_AMBULATORY_CARE_PROVIDER_SITE_OTHER): Payer: BC Managed Care – PPO | Admitting: Internal Medicine

## 2012-06-16 ENCOUNTER — Encounter: Payer: Self-pay | Admitting: Internal Medicine

## 2012-06-16 VITALS — BP 110/62 | HR 78 | Temp 97.8°F | Resp 16 | Wt 129.0 lb

## 2012-06-16 DIAGNOSIS — G5600 Carpal tunnel syndrome, unspecified upper limb: Secondary | ICD-10-CM

## 2012-06-16 DIAGNOSIS — F411 Generalized anxiety disorder: Secondary | ICD-10-CM

## 2012-06-16 DIAGNOSIS — G5601 Carpal tunnel syndrome, right upper limb: Secondary | ICD-10-CM

## 2012-06-16 MED ORDER — NAPROXEN 375 MG PO TABS: 375.0000 mg | ORAL_TABLET | Freq: Two times a day (BID) | ORAL | Status: DC

## 2012-06-16 MED ORDER — CLONAZEPAM 1 MG PO TABS: 1.0000 mg | ORAL_TABLET | Freq: Two times a day (BID) | ORAL | Status: DC | PRN

## 2012-06-16 NOTE — Assessment & Plan Note (Signed)
Continue the current meds

## 2012-06-16 NOTE — Assessment & Plan Note (Signed)
Start nsaids Wear splint Pt ed material was given Will get a NCS/EMG done to check the severity of this

## 2012-06-16 NOTE — Progress Notes (Signed)
Subjective:    Patient ID: Barbara Bray, female    DOB: April 15, 1990, 22 y.o.   MRN: [id number]  Hand Pain  Incident onset: 2-3 months, associated with a new job at [employer]. The incident occurred at work. The injury mechanism was repetitive motion. The pain is present in the right hand and right wrist. The quality of the pain is described as aching. The pain does not radiate. The pain is at a severity of 1/10. The pain is mild. The pain has been constant since the incident. Pertinent negatives include no chest pain, muscle weakness, numbness or tingling. The symptoms are aggravated by movement. She has tried nothing for the symptoms. The treatment provided no relief.      Review of Systems  Constitutional: Negative.   HENT: Negative for neck pain.   Eyes: Negative.   Respiratory: Negative.   Cardiovascular: Negative.  Negative for chest pain.  Gastrointestinal: Negative.   Endocrine: Negative.   Genitourinary: Negative.   Musculoskeletal: Negative.  Negative for back pain.  Skin: Negative.   Allergic/Immunologic: Negative.   Neurological: Negative.  Negative for tingling and numbness.  Hematological: Negative.  Negative for adenopathy. Does not bruise/bleed easily.  Psychiatric/Behavioral: Negative for suicidal ideas, hallucinations, behavioral problems, confusion, sleep disturbance, self-injury, decreased concentration and agitation. The patient is nervous/anxious. The patient is not hyperactive.   All other systems reviewed and are negative.       Objective:   Physical Exam  Vitals reviewed. Constitutional: She is oriented to person, place, and time. She appears well-developed and well-nourished. No distress.  HENT:  Head: Normocephalic and atraumatic.  Mouth/Throat: Oropharynx is clear and moist. No oropharyngeal exudate.  Eyes: Conjunctivae are normal. Right eye exhibits no discharge. Left eye exhibits no discharge. No scleral icterus.  Neck: Normal range of motion. Neck  supple. No JVD present. No tracheal deviation present. No thyromegaly present.  Cardiovascular: Normal rate, regular rhythm and normal heart sounds.  Exam reveals no gallop and no friction rub.   No murmur heard. Pulmonary/Chest: Effort normal and breath sounds normal. No stridor. No respiratory distress. She has no wheezes. She has no rales. She exhibits no tenderness.  Abdominal: Soft. Bowel sounds are normal. She exhibits no distension and no mass. There is no tenderness. There is no rebound and no guarding.  Musculoskeletal: Normal range of motion. She exhibits no edema and no tenderness.       Right wrist: Normal. She exhibits normal range of motion, no tenderness, no bony tenderness, no swelling, no effusion, no crepitus, no deformity and no laceration.       Right hand: Normal. She exhibits normal range of motion, no tenderness, no bony tenderness, normal two-point discrimination, normal capillary refill, no deformity, no laceration and no swelling. Normal sensation noted. Normal strength noted. She exhibits no finger abduction, no thumb/finger opposition and no wrist extension trouble.  Lymphadenopathy:    She has no cervical adenopathy.  Neurological: She is oriented to person, place, and time.  Skin: Skin is warm and dry. No rash noted. She is not diaphoretic. No erythema. No pallor.  Psychiatric: She has a normal mood and affect. Her behavior is normal. Judgment and thought content normal. Her mood appears not anxious. Her affect is not angry, not blunt, not labile and not inappropriate. Her speech is not rapid and/or pressured, not delayed, not tangential and not slurred. Cognition and memory are normal. She does not exhibit a depressed mood. She is communicative.     No results found  for this basename: WBC, HGB, HCT, PLT, GLUCOSE, CHOL, TRIG, HDL, LDLDIRECT, LDLCALC, ALT, AST, NA, K, CL, CREATININE, BUN, CO2, TSH, PSA, INR, GLUF, HGBA1C, MICROALBUR       Assessment & Plan:

## 2012-06-16 NOTE — Patient Instructions (Signed)

## 2012-07-01 ENCOUNTER — Ambulatory Visit (INDEPENDENT_AMBULATORY_CARE_PROVIDER_SITE_OTHER): Payer: BC Managed Care – PPO | Admitting: Diagnostic Neuroimaging

## 2012-07-01 ENCOUNTER — Ambulatory Visit (INDEPENDENT_AMBULATORY_CARE_PROVIDER_SITE_OTHER): Payer: BC Managed Care – PPO

## 2012-07-01 DIAGNOSIS — Z0289 Encounter for other administrative examinations: Secondary | ICD-10-CM

## 2012-07-01 DIAGNOSIS — M79644 Pain in right finger(s): Secondary | ICD-10-CM

## 2012-07-01 DIAGNOSIS — R209 Unspecified disturbances of skin sensation: Secondary | ICD-10-CM

## 2012-07-01 NOTE — Procedures (Signed)
History of present illness: 23 years old right-handed Caucasian female, presenting with right thumb pain  Nerve conduction studies: Bilateral median, ulnar sensory and motor responses were normal  Electromyography:  Selected needle examination was performed at the right upper extremity muscles, and the right cervical paraspinal muscles  Needle examination of right pronator teres, extensor digitorum communis, biceps, triceps, deltoid was normal  There was no spontaneous activity at right cervical paraspinal muscles, right C5, 6 and 7  In conclusion:  This is a normal study. There is no electrodiagnostic evidence of right upper extremity neuropathy, or right cervical radiculopathy

## 2012-08-18 ENCOUNTER — Encounter: Payer: Self-pay | Admitting: Internal Medicine

## 2012-08-18 ENCOUNTER — Ambulatory Visit (INDEPENDENT_AMBULATORY_CARE_PROVIDER_SITE_OTHER)
Admission: RE | Admit: 2012-08-18 | Discharge: 2012-08-18 | Disposition: A | Discharge status: Home / Self Care | Payer: BC Managed Care – PPO | Source: Ambulatory Visit | Attending: Internal Medicine | Admitting: Internal Medicine

## 2012-08-18 ENCOUNTER — Other Ambulatory Visit (INDEPENDENT_AMBULATORY_CARE_PROVIDER_SITE_OTHER): Payer: BC Managed Care – PPO

## 2012-08-18 ENCOUNTER — Ambulatory Visit (INDEPENDENT_AMBULATORY_CARE_PROVIDER_SITE_OTHER): Payer: BC Managed Care – PPO | Admitting: Internal Medicine

## 2012-08-18 VITALS — BP 122/78 | HR 98 | Temp 97.5°F | Resp 16 | Wt 127.0 lb

## 2012-08-18 DIAGNOSIS — Z0000 Encounter for general adult medical examination without abnormal findings: Secondary | ICD-10-CM

## 2012-08-18 DIAGNOSIS — R7989 Other specified abnormal findings of blood chemistry: Secondary | ICD-10-CM

## 2012-08-18 DIAGNOSIS — J45909 Unspecified asthma, uncomplicated: Secondary | ICD-10-CM

## 2012-08-18 DIAGNOSIS — M542 Cervicalgia: Secondary | ICD-10-CM

## 2012-08-18 DIAGNOSIS — R000 Tachycardia, unspecified: Secondary | ICD-10-CM

## 2012-08-18 DIAGNOSIS — J4530 Mild persistent asthma, uncomplicated: Secondary | ICD-10-CM

## 2012-08-18 LAB — COMPREHENSIVE METABOLIC PANEL
Albumin: 4 g/dL (ref 3.5–5.2)
Alkaline Phosphatase: 53 U/L (ref 39–117)
BUN: 6 mg/dL (ref 6–23)
CO2: 23 mEq/L (ref 19–32)
GFR: 96.79 mL/min (ref >60.00)
Glucose, Bld: 106 mg/dL — ABNORMAL HIGH (ref 70–99)
Total Bilirubin: 0.7 mg/dL (ref 0.3–1.2)
Total Protein: 7.6 g/dL (ref 6.0–8.3)

## 2012-08-18 LAB — LIPID PANEL
HDL: 87.7 mg/dL (ref >39.00)
Triglycerides: 113 mg/dL (ref 0.0–149.0)

## 2012-08-18 LAB — CBC WITH DIFFERENTIAL/PLATELET
Basophils Relative: 0.5 % (ref 0.0–3.0)
Eosinophils Relative: 1.2 % (ref 0.0–5.0)
HCT: 41.5 % (ref 36.0–46.0)
MCV: 92.3 fl (ref 78.0–100.0)
Monocytes Absolute: 0.7 10*3/uL (ref 0.1–1.0)
Monocytes Relative: 8.7 % (ref 3.0–12.0)
Neutrophils Relative %: 59.7 % (ref 43.0–77.0)
Platelets: 232 10*3/uL (ref 150.0–400.0)
RBC: 4.5 Mil/uL (ref 3.87–5.11)
WBC: 7.9 10*3/uL (ref 4.5–10.5)

## 2012-08-18 LAB — URINALYSIS, ROUTINE W REFLEX MICROSCOPIC
Hgb urine dipstick: NEGATIVE
Specific Gravity, Urine: 1.02 (ref 1.000–1.030)
Total Protein, Urine: NEGATIVE
Urine Glucose: NEGATIVE

## 2012-08-18 LAB — TSH: TSH: 0.78 u[IU]/mL (ref 0.35–5.50)

## 2012-08-18 LAB — LDL CHOLESTEROL, DIRECT: Direct LDL: 144.1 mg/dL

## 2012-08-18 MED ORDER — METHOCARBAMOL 500 MG PO TABS: 500.0000 mg | ORAL_TABLET | Freq: Four times a day (QID) | ORAL | Status: DC

## 2012-08-18 NOTE — Assessment & Plan Note (Signed)
She refused a pneumovax today

## 2012-08-18 NOTE — Assessment & Plan Note (Signed)
Her plain xray shows cervical straightening but it is otherwise normal She will try a muscle relaxer and will continue with massage therapy

## 2012-08-18 NOTE — Progress Notes (Signed)
Subjective:    Patient ID: Barbara Bray, female    DOB: 12/22/1990, 22 y.o.   MRN: [id number]  Palpitations  This is a chronic problem. The current episode started more than 1 year ago. The problem occurs intermittently. The problem has been unchanged. Nothing aggravates the symptoms. Associated symptoms include anxiety. Pertinent negatives include no chest fullness, chest pain, coughing, diaphoresis, dizziness, fever, irregular heartbeat, malaise/fatigue, nausea, near-syncope, numbness, shortness of breath, syncope, vomiting or weakness. She has tried nothing for the symptoms. The treatment provided no relief. There are no known risk factors. Her past medical history is significant for anxiety. There is no history of anemia, drug use, heart disease, hyperthyroidism or a valve disorder. 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  Negative for fever, malaise/fatigue and diaphoresis.  HENT: Positive for neck pain and neck stiffness.        She has had diffuse neck pain for 9 months, she did not have any trauma or injury. She works in [employer] and feels like the work there causes the pain, nsaids have not helped but massage does. The pain does not radiate. She describes it as a tension, there is no N/W/T in her arms or legs.  Eyes: Negative.   Respiratory: Negative.  Negative for apnea, cough, choking, chest tightness, shortness of breath, wheezing and stridor.   Cardiovascular: Positive for palpitations. Negative for chest pain, leg swelling, syncope and near-syncope.  Gastrointestinal: Negative.  Negative for nausea, vomiting, abdominal pain, diarrhea and constipation.  Endocrine: Negative.   Genitourinary: Negative.   Musculoskeletal: Negative for myalgias, back pain, joint swelling and gait problem.  Skin: Negative.   Allergic/Immunologic: Negative.   Neurological: Negative.  Negative for dizziness, tremors, syncope, speech  difficulty, weakness, light-headedness, numbness and headaches.  Hematological: Negative.  Negative for adenopathy. Does not bruise/bleed easily.  Psychiatric/Behavioral: Negative for suicidal ideas, hallucinations, behavioral problems, confusion, sleep disturbance, self-injury, decreased concentration and agitation. The patient is nervous/anxious. The patient is not hyperactive.        Objective:   Physical Exam  Vitals reviewed. Constitutional: She is oriented to person, place, and time. She appears well-developed and well-nourished. No distress.  HENT:  Head: Normocephalic and atraumatic.  Mouth/Throat: Oropharynx is clear and moist. No oropharyngeal exudate.  Eyes: Conjunctivae are normal. Right eye exhibits no discharge. Left eye exhibits no discharge. No scleral icterus.  Neck: Normal range of motion. Neck supple. No JVD present. No tracheal deviation present. No thyromegaly present.  Cardiovascular: Regular rhythm, S1 normal, S2 normal, normal heart sounds, intact distal pulses and normal pulses.  Tachycardia present.  Exam reveals no gallop and no friction rub.   No murmur heard. Pulmonary/Chest: Effort normal and breath sounds normal. No stridor. No respiratory distress. She has no wheezes. She has no rales. She exhibits no tenderness.  Abdominal: Soft. Bowel sounds are normal. She exhibits no distension and no mass. There is no tenderness. There is no rebound and no guarding.  Musculoskeletal: Normal range of motion. She exhibits no edema and no tenderness.       Cervical back: Normal. She exhibits normal range of motion, no tenderness, no bony tenderness, no swelling, no edema, no deformity, no laceration, no pain, no spasm and normal pulse.  Lymphadenopathy:    She has no cervical adenopathy.  Neurological: She is oriented to person, place, and time.  Skin: Skin is warm and dry. No rash noted.  She is not diaphoretic. No erythema. No pallor.  Psychiatric: She has a normal mood and  affect. Her behavior is normal. Judgment and thought content normal.     No results found for this basename: WBC, HGB, HCT, PLT, GLUCOSE, CHOL, TRIG, HDL, LDLDIRECT, LDLCALC, ALT, AST, NA, K, CL, CREATININE, BUN, CO2, TSH, PSA, INR, GLUF, HGBA1C, MICROALBUR       Assessment & Plan:

## 2012-08-18 NOTE — Patient Instructions (Signed)
Palpitations  A palpitation is the feeling that your heartbeat is irregular or is faster than normal. It may feel like your heart is fluttering or skipping a beat. Palpitations are usually not a serious problem. However, in some cases, you may need further medical evaluation. CAUSES  Palpitations can be caused by:  Smoking.  Caffeine or other stimulants, such as diet pills or energy drinks.  Alcohol.  Stress and anxiety.  Strenuous physical activity.  Fatigue.  Certain medicines.  Heart disease, especially if you have a history of arrhythmias. This includes atrial fibrillation, atrial flutter, or supraventricular tachycardia.  An improperly working pacemaker or defibrillator. DIAGNOSIS  To find the cause of your palpitations, your caregiver will take your history and perform a physical exam. Tests may also be done, including:  Electrocardiography (ECG). This test records the heart's electrical activity.  Cardiac monitoring. This allows your caregiver to monitor your heart rate and rhythm in real time.  Holter monitor. This is a portable device that records your heartbeat and can help diagnose heart arrhythmias. It allows your caregiver to track your heart activity for several days, if needed.  Stress tests by exercise or by giving medicine that makes the heart beat faster. TREATMENT  Treatment of palpitations depends on the cause of your symptoms and can vary greatly. Most cases of palpitations do not require any treatment other than time, relaxation, and monitoring your symptoms. Other causes, such as atrial fibrillation, atrial flutter, or supraventricular tachycardia, usually require further treatment. HOME CARE INSTRUCTIONS   Avoid:  Caffeinated coffee, tea, soft drinks, diet pills, and energy drinks.  Chocolate.  Alcohol.  Stop smoking if you smoke.  Reduce your stress and anxiety. Things that can help you relax include:  A method that measures bodily functions so  you can learn to control them (biofeedback).  Yoga.  Meditation.  Physical activity such as swimming, jogging, or walking.  Get plenty of rest and sleep. SEEK MEDICAL CARE IF:   You continue to have a fast or irregular heartbeat beyond 24 hours.  Your palpitations occur more often. SEEK IMMEDIATE MEDICAL CARE IF:  You develop chest pain or shortness of breath.  You have a severe headache.  You feel dizzy, or you faint. MAKE SURE YOU:  Understand these instructions.  Will watch your condition.  Will get help right away if you are not doing well or get worse. Document Released: 12/22/1999 Document Revised: 06/25/2011 Document Reviewed: 02/22/2011 [facility] Patient Information 2014 [place], [place]. Preventive Care for Adults, Female A healthy lifestyle and preventive care can promote health and wellness. Preventive health guidelines for women include the following key practices.  A routine yearly physical is a good way to check with your caregiver about your health and preventive screening. It is a chance to share any concerns and updates on your health, and to receive a thorough exam.  Visit your dentist for a routine exam and preventive care every 6 months. Brush your teeth twice a day and floss once a day. Good oral hygiene prevents tooth decay and gum disease.  The frequency of eye exams is based on your age, health, family medical history, use of contact lenses, and other factors. Follow your caregiver's recommendations for frequency of eye exams.  Eat a healthy diet. Foods like vegetables, fruits, whole grains, low-fat dairy products, and lean protein foods contain the nutrients you need without too many calories. Decrease your intake of foods high in solid fats, added sugars, and salt. Eat the right amount  of calories for you.Get information about a proper diet from your caregiver, if necessary.  Regular physical exercise is one of the most important things you can do for  your health. Most adults should get at least 150 minutes of moderate-intensity exercise (any activity that increases your heart rate and causes you to sweat) each week. In addition, most adults need muscle-strengthening exercises on 2 or more days a week.  Maintain a healthy weight. The body mass index (BMI) is a screening tool to identify possible weight problems. It provides an estimate of body fat based on height and weight. Your caregiver can help determine your BMI, and can help you achieve or maintain a healthy weight.For adults 20 years and older:  A BMI below 18.5 is considered underweight.  A BMI of 18.5 to 24.9 is normal.  A BMI of 25 to 29.9 is considered overweight.  A BMI of 30 and above is considered obese.  Maintain normal blood lipids and cholesterol levels by exercising and minimizing your intake of saturated fat. Eat a balanced diet with plenty of fruit and vegetables. Blood tests for lipids and cholesterol should begin at age 49 and be repeated every 5 years. If your lipid or cholesterol levels are high, you are over 50, or you are at high risk for heart disease, you may need your cholesterol levels checked more frequently.Ongoing high lipid and cholesterol levels should be treated with medicines if diet and exercise are not effective.  If you smoke, find out from your caregiver how to quit. If you do not use tobacco, do not start.  If you are pregnant, do not drink alcohol. If you are breastfeeding, be very cautious about drinking alcohol. If you are not pregnant and choose to drink alcohol, do not exceed 1 drink per day. One drink is considered to be 12 ounces (355 mL) of beer, 5 ounces (148 mL) of wine, or 1.5 ounces (44 mL) of liquor.  Avoid use of street drugs. Do not share needles with anyone. Ask for help if you need support or instructions about stopping the use of drugs.  High blood pressure causes heart disease and increases the risk of stroke. Your blood pressure  should be checked at least every 1 to 2 years. Ongoing high blood pressure should be treated with medicines if weight loss and exercise are not effective.  If you are 56 to 23 years old, ask your caregiver if you should take aspirin to prevent strokes.  Diabetes screening involves taking a blood sample to check your fasting blood sugar level. This should be done once every 3 years, after age 17, if you are within normal weight and without risk factors for diabetes. Testing should be considered at a younger age or be carried out more frequently if you are overweight and have at least 1 risk factor for diabetes.  Breast cancer screening is essential preventive care for women. You should practice "breast self-awareness." This means understanding the normal appearance and feel of your breasts and may include breast self-examination. Any changes detected, no matter how small, should be reported to a caregiver. Women in their 49s and 30s should have a clinical breast exam (CBE) by a caregiver as part of a regular health exam every 1 to 3 years. After age 15, women should have a CBE every year. Starting at age 61, women should consider having a mammography (breast X-ray test) every year. Women who have a family history of breast cancer should talk to their  caregiver about genetic screening. Women at a high risk of breast cancer should talk to their caregivers about having magnetic resonance imaging (MRI) and a mammography every year.  The Pap test is a screening test for cervical cancer. A Pap test can show cell changes on the cervix that might become cervical cancer if left untreated. A Pap test is a procedure in which cells are obtained and examined from the lower end of the uterus (cervix).  Women should have a Pap test starting at age 19.  Between ages 27 and 59, Pap tests should be repeated every 2 years.  Beginning at age 52, you should have a Pap test every 3 years as long as the past 3 Pap tests have  been normal.  Some women have medical problems that increase the chance of getting cervical cancer. Talk to your caregiver about these problems. It is especially important to talk to your caregiver if a new problem develops soon after your last Pap test. In these cases, your caregiver may recommend more frequent screening and Pap tests.  The above recommendations are the same for women who have or have not gotten the vaccine for human papillomavirus (HPV).  If you had a hysterectomy for a problem that was not cancer or a condition that could lead to cancer, then you no longer need Pap tests. Even if you no longer need a Pap test, a regular exam is a good idea to make sure no other problems are starting.  If you are between ages 58 and 70, and you have had normal Pap tests going back 10 years, you no longer need Pap tests. Even if you no longer need a Pap test, a regular exam is a good idea to make sure no other problems are starting.  If you have had past treatment for cervical cancer or a condition that could lead to cancer, you need Pap tests and screening for cancer for at least 20 years after your treatment.  If Pap tests have been discontinued, risk factors (such as a new sexual partner) need to be reassessed to determine if screening should be resumed.  The HPV test is an additional test that may be used for cervical cancer screening. The HPV test looks for the virus that can cause the cell changes on the cervix. The cells collected during the Pap test can be tested for HPV. The HPV test could be used to screen women aged 73 years and older, and should be used in women of any age who have unclear Pap test results. After the age of 94, women should have HPV testing at the same frequency as a Pap test.  Colorectal cancer can be detected and often prevented. Most routine colorectal cancer screening begins at the age of 67 and continues through age 25. However, your caregiver may recommend  screening at an earlier age if you have risk factors for colon cancer. On a yearly basis, your caregiver may provide home test kits to check for hidden blood in the stool. Use of a small camera at the end of a tube, to directly examine the colon (sigmoidoscopy or colonoscopy), can detect the earliest forms of colorectal cancer. Talk to your caregiver about this at age 63, when routine screening begins. Direct examination of the colon should be repeated every 5 to 10 years through age 71, unless early forms of pre-cancerous polyps or small growths are found.  Hepatitis C blood testing is recommended for all people born from [birth year]  through 1965 and any individual with known risks for hepatitis C.  Practice safe sex. Use condoms and avoid high-risk sexual practices to reduce the spread of sexually transmitted infections (STIs). STIs include gonorrhea, chlamydia, syphilis, trichomonas, herpes, HPV, and human immunodeficiency virus (HIV). Herpes, HIV, and HPV are viral illnesses that have no cure. They can result in disability, cancer, and death. Sexually active women aged 76 and younger should be checked for chlamydia. Older women with new or multiple partners should also be tested for chlamydia. Testing for other STIs is recommended if you are sexually active and at increased risk.  Osteoporosis is a disease in which the bones lose minerals and strength with aging. This can result in serious bone fractures. The risk of osteoporosis can be identified using a bone density scan. Women ages 67 and over and women at risk for fractures or osteoporosis should discuss screening with their caregivers. Ask your caregiver whether you should take a calcium supplement or vitamin D to reduce the rate of osteoporosis.  Menopause can be associated with physical symptoms and risks. Hormone replacement therapy is available to decrease symptoms and risks. You should talk to your caregiver about whether hormone replacement therapy  is right for you.  Use sunscreen with sun protection factor (SPF) of 30 or more. Apply sunscreen liberally and repeatedly throughout the day. You should seek shade when your shadow is shorter than you. Protect yourself by wearing long sleeves, pants, a wide-brimmed hat, and sunglasses year round, whenever you are outdoors.  Once a month, do a whole body skin exam, using a mirror to look at the skin on your back. Notify your caregiver of new moles, moles that have irregular borders, moles that are larger than a pencil eraser, or moles that have changed in shape or color.  Stay current with required immunizations.  Influenza. You need a dose every fall (or winter). The composition of the flu vaccine changes each year, so being vaccinated once is not enough.  Pneumococcal polysaccharide. You need 1 to 2 doses if you smoke cigarettes or if you have certain chronic medical conditions. You need 1 dose at age 70 (or older) if you have never been vaccinated.  Tetanus, diphtheria, pertussis (Tdap, Td). Get 1 dose of Tdap vaccine if you are younger than age 43, are over 64 and have contact with an infant, are a Research scientist (physical sciences), are pregnant, or simply want to be protected from whooping cough. After that, you need a Td booster dose every 10 years. Consult your caregiver if you have not had at least 3 tetanus and diphtheria-containing shots sometime in your life or have a deep or dirty wound.  HPV. You need this vaccine if you are a woman age 44 or younger. The vaccine is given in 3 doses over 6 months.  Measles, mumps, rubella (MMR). You need at least 1 dose of MMR if you were born in 1957 or later. You may also need a second dose.  Meningococcal. If you are age 78 to 52 and a first-year college student living in a residence hall, or have one of several medical conditions, you need to get vaccinated against meningococcal disease. You may also need additional booster doses.  Zoster (shingles). If you are  age 66 or older, you should get this vaccine.  Varicella (chickenpox). If you have never had chickenpox or you were vaccinated but received only 1 dose, talk to your caregiver to find out if you need this vaccine.  Hepatitis A.  You need this vaccine if you have a specific risk factor for hepatitis A virus infection or you simply wish to be protected from this disease. The vaccine is usually given as 2 doses, 6 to 18 months apart.  Hepatitis B. You need this vaccine if you have a specific risk factor for hepatitis B virus infection or you simply wish to be protected from this disease. The vaccine is given in 3 doses, usually over 6 months. Preventive Services / Frequency Ages 56 to 8  Blood pressure check.** / Every 1 to 2 years.  Lipid and cholesterol check.** / Every 5 years beginning at age 61.  Clinical breast exam.** / Every 3 years for women in their 41s and 30s.  Pap test.** / Every 2 years from ages 81 through 45. Every 3 years starting at age 67 through age 75 or 34 with a history of 3 consecutive normal Pap tests.  HPV screening.** / Every 3 years from ages 62 through ages 45 to 62 with a history of 3 consecutive normal Pap tests.  Hepatitis C blood test.** / For any individual with known risks for hepatitis C.  Skin self-exam. / Monthly.  Influenza immunization.** / Every year.  Pneumococcal polysaccharide immunization.** / 1 to 2 doses if you smoke cigarettes or if you have certain chronic medical conditions.  Tetanus, diphtheria, pertussis (Tdap, Td) immunization. / A one-time dose of Tdap vaccine. After that, you need a Td booster dose every 10 years.  HPV immunization. / 3 doses over 6 months, if you are 64 and younger.  Measles, mumps, rubella (MMR) immunization. / You need at least 1 dose of MMR if you were born in 1957 or later. You may also need a second dose.  Meningococcal immunization. / 1 dose if you are age 30 to 71 and a first-year college student living in  a residence hall, or have one of several medical conditions, you need to get vaccinated against meningococcal disease. You may also need additional booster doses.  Varicella immunization.** / Consult your caregiver.  Hepatitis A immunization.** / Consult your caregiver. 2 doses, 6 to 18 months apart.  Hepatitis B immunization.** / Consult your caregiver. 3 doses usually over 6 months. Ages 5 to 41  Blood pressure check.** / Every 1 to 2 years.  Lipid and cholesterol check.** / Every 5 years beginning at age 60.  Clinical breast exam.** / Every year after age 40.  Mammogram.** / Every year beginning at age 54 and continuing for as long as you are in good health. Consult with your caregiver.  Pap test.** / Every 3 years starting at age 55 through age 58 or 74 with a history of 3 consecutive normal Pap tests.  HPV screening.** / Every 3 years from ages 19 through ages 40 to 54 with a history of 3 consecutive normal Pap tests.  Fecal occult blood test (FOBT) of stool. / Every year beginning at age 23 and continuing until age 74. You may not need to do this test if you get a colonoscopy every 10 years.  Flexible sigmoidoscopy or colonoscopy.** / Every 5 years for a flexible sigmoidoscopy or every 10 years for a colonoscopy beginning at age 11 and continuing until age 71.  Hepatitis C blood test.** / For all people born from [birth year] through 1965 and any individual with known risks for hepatitis C.  Skin self-exam. / Monthly.  Influenza immunization.** / Every year.  Pneumococcal polysaccharide immunization.** / 1 to 2 doses if  you smoke cigarettes or if you have certain chronic medical conditions.  Tetanus, diphtheria, pertussis (Tdap, Td) immunization.** / A one-time dose of Tdap vaccine. After that, you need a Td booster dose every 10 years.  Measles, mumps, rubella (MMR) immunization. / You need at least 1 dose of MMR if you were born in 1957 or later. You may also need a second  dose.  Varicella immunization.** / Consult your caregiver.  Meningococcal immunization.** / Consult your caregiver.  Hepatitis A immunization.** / Consult your caregiver. 2 doses, 6 to 18 months apart.  Hepatitis B immunization.** / Consult your caregiver. 3 doses, usually over 6 months. Ages 23 and over  Blood pressure check.** / Every 1 to 2 years.  Lipid and cholesterol check.** / Every 5 years beginning at age 32.  Clinical breast exam.** / Every year after age 26.  Mammogram.** / Every year beginning at age 32 and continuing for as long as you are in good health. Consult with your caregiver.  Pap test.** / Every 3 years starting at age 71 through age 61 or 94 with a 3 consecutive normal Pap tests. Testing can be stopped between 65 and 70 with 3 consecutive normal Pap tests and no abnormal Pap or HPV tests in the past 10 years.  HPV screening.** / Every 3 years from ages 60 through ages 20 or 19 with a history of 3 consecutive normal Pap tests. Testing can be stopped between 65 and 70 with 3 consecutive normal Pap tests and no abnormal Pap or HPV tests in the past 10 years.  Fecal occult blood test (FOBT) of stool. / Every year beginning at age 74 and continuing until age 62. You may not need to do this test if you get a colonoscopy every 10 years.  Flexible sigmoidoscopy or colonoscopy.** / Every 5 years for a flexible sigmoidoscopy or every 10 years for a colonoscopy beginning at age 65 and continuing until age 19.  Hepatitis C blood test.** / For all people born from [birth year] through 1965 and any individual with known risks for hepatitis C.  Osteoporosis screening.** / A one-time screening for women ages 65 and over and women at risk for fractures or osteoporosis.  Skin self-exam. / Monthly.  Influenza immunization.** / Every year.  Pneumococcal polysaccharide immunization.** / 1 dose at age 72 (or older) if you have never been vaccinated.  Tetanus, diphtheria, pertussis  (Tdap, Td) immunization. / A one-time dose of Tdap vaccine if you are over 65 and have contact with an infant, are a Research scientist (physical sciences), or simply want to be protected from whooping cough. After that, you need a Td booster dose every 10 years.  Varicella immunization.** / Consult your caregiver.  Meningococcal immunization.** / Consult your caregiver.  Hepatitis A immunization.** / Consult your caregiver. 2 doses, 6 to 18 months apart.  Hepatitis B immunization.** / Check with your caregiver. 3 doses, usually over 6 months. ** Family history and personal history of risk and conditions may change your caregiver's recommendations. Document Released: 02/19/2001 Document Revised: 03/18/2011 Document Reviewed: 05/21/2010 [facility] Patient Information 2014 [place], [place].

## 2012-08-18 NOTE — Assessment & Plan Note (Addendum)
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

## 2012-08-18 NOTE — Assessment & Plan Note (Signed)
She refused a Tdap and pneumovax today Exam done Labs ordered Pt ed material was given

## 2012-08-19 ENCOUNTER — Encounter: Payer: Self-pay | Admitting: Internal Medicine

## 2012-08-19 LAB — GC/CHLAMYDIA PROBE AMP, URINE
Chlamydia, Swab/Urine, PCR: NEGATIVE
GC Probe Amp, Urine: NEGATIVE

## 2012-08-20 ENCOUNTER — Other Ambulatory Visit: Payer: Self-pay | Admitting: Internal Medicine

## 2012-08-20 DIAGNOSIS — E785 Hyperlipidemia, unspecified: Secondary | ICD-10-CM

## 2012-08-20 MED ORDER — COLESEVELAM HCL 3.75 G PO PACK: 1.0000 | PACK | Freq: Every day | ORAL | Status: DC

## 2012-09-17 ENCOUNTER — Encounter: Payer: Self-pay | Admitting: *Deleted

## 2012-09-17 ENCOUNTER — Encounter (INDEPENDENT_AMBULATORY_CARE_PROVIDER_SITE_OTHER): Payer: BC Managed Care – PPO

## 2012-09-17 DIAGNOSIS — R000 Tachycardia, unspecified: Secondary | ICD-10-CM

## 2012-09-17 DIAGNOSIS — R002 Palpitations: Secondary | ICD-10-CM

## 2012-09-17 NOTE — Progress Notes (Signed)
Patient ID: Barbara Bray, female   DOB: 03/11/1990, 22 y.o.   MRN: [id number] [employer] verite 30 day cardiac event monitor applied to patient.

## 2012-09-18 ENCOUNTER — Telehealth: Payer: Self-pay | Admitting: *Deleted
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
## 2012-10-07 ENCOUNTER — Telehealth: Payer: Self-pay | Admitting: Nurse Practitioner

## 2012-10-07 NOTE — Telephone Encounter (Signed)
Called patient and left message for call back after receiving an ecardio report showing sinus tach at 0602 today.

## 2012-10-07 NOTE — Telephone Encounter (Signed)
Patient returned call.  Patient reports she received a call from [employer] at approximately 0730, which is 1 1/2 hours after said event.  Patient states she does not recall exactly what she was doing at the time other than getting ready for work.  Patient denies chest discomfort, SOB, n/v, light-headedness or dizziness this morning.  Patient states she feels fine now as well.  Patient inquired as to what her heart rate was and states she is so used to elevated heart rate, that she doesn't even notice.  I advised patient to drink at least 64 oz of water daily and advised her to decrease her caffeine intake.  Patient states she has cut back on caffeine and will measure water intake so as to be purposeful in keeping up with this.  I advised patient that I will send message to Dr. Graciela Husbands and Dory Horn, RN, Dr. Odessa Fleming primary nurse and to call our office if she feels anything abnormal.  Patient verbalized understanding and agreement.

## 2012-10-22 ENCOUNTER — Encounter: Payer: Self-pay | Admitting: Internal Medicine

## 2012-10-22 ENCOUNTER — Encounter: Payer: Self-pay | Admitting: *Deleted

## 2012-10-22 ENCOUNTER — Ambulatory Visit (INDEPENDENT_AMBULATORY_CARE_PROVIDER_SITE_OTHER): Payer: BC Managed Care – PPO | Admitting: Internal Medicine

## 2012-10-22 VITALS — BP 104/70 | HR 90 | Temp 99.3°F

## 2012-10-22 DIAGNOSIS — J029 Acute pharyngitis, unspecified: Secondary | ICD-10-CM

## 2012-10-22 MED ORDER — FAMOTIDINE 20 MG PO TABS: 20.0000 mg | ORAL_TABLET | Freq: Two times a day (BID) | ORAL | Status: DC

## 2012-10-22 MED ORDER — NAPROXEN 375 MG PO TABS: 375.0000 mg | ORAL_TABLET | Freq: Two times a day (BID) | ORAL | Status: DC

## 2012-10-22 MED ORDER — MAGIC MOUTHWASH W/LIDOCAINE: 5.0000 mL | Freq: Three times a day (TID) | ORAL | Status: DC | PRN

## 2012-10-22 NOTE — Progress Notes (Signed)
Pre-visit discussion using our clinic review tool. No additional management support is needed unless otherwise documented below in the visit note.  
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
## 2012-10-22 NOTE — Patient Instructions (Addendum)
It was good to see you today.  We have reviewed your prior records including labs and tests today  Use prescription mouthwash for throat pain as needed, continue Naprosyn twice daily and use generic Pepcid twice a day for the next week to reduce acid then as needed  Your prescription(s) have been submitted to your pharmacy. Please take as directed and contact our office if you believe you are having problem(s) with the medication(s).  Work excuse note for today provided  Call if symptoms unimproved in next 7 days, sooner if worse  Viral Pharyngitis Viral pharyngitis is a viral infection that produces redness, pain, and swelling (inflammation) of the throat. It can spread from person to person (contagious). CAUSES Viral pharyngitis is caused by inhaling a large amount of certain germs called viruses. Many different viruses cause viral pharyngitis. SYMPTOMS Symptoms of viral pharyngitis include:  Sore throat.  Tiredness.  Stuffy nose.  Low-grade fever.  Congestion.  Cough. TREATMENT Treatment includes rest, drinking plenty of fluids, and the use of over-the-counter medication (approved by your caregiver). HOME CARE INSTRUCTIONS   Drink enough fluids to keep your urine clear or pale yellow.  Eat soft, cold foods such as ice cream, frozen ice pops, or gelatin dessert.  Gargle with warm salt water (1 tsp salt per 1 qt of water).  If over age 67, throat lozenges may be used safely.  Only take over-the-counter or prescription medicines for pain, discomfort, or fever as directed by your caregiver. Do not take aspirin. To help prevent spreading viral pharyngitis to others, avoid:  Mouth-to-mouth contact with others.  Sharing utensils for eating and drinking.  Coughing around others. SEEK MEDICAL CARE IF:   You are better in a few days, then become worse.  You have a fever or pain not helped by pain medicines.  There are any other changes that concern you. Document  Released: 10/03/2004 Document Revised: 03/18/2011 Document Reviewed: 03/01/2010 [facility] Patient Information 2014 [place], [place].

## 2012-11-05 ENCOUNTER — Encounter: Payer: Self-pay | Admitting: Internal Medicine

## 2012-11-12 ENCOUNTER — Other Ambulatory Visit: Payer: Self-pay

## 2012-12-21 ENCOUNTER — Encounter: Payer: Self-pay | Admitting: Internal Medicine

## 2012-12-21 ENCOUNTER — Ambulatory Visit (INDEPENDENT_AMBULATORY_CARE_PROVIDER_SITE_OTHER): Payer: BC Managed Care – PPO | Admitting: Internal Medicine

## 2012-12-21 VITALS — BP 120/78 | HR 96 | Temp 97.6°F | Resp 16 | Ht 63.0 in | Wt 126.0 lb

## 2012-12-21 DIAGNOSIS — Z23 Encounter for immunization: Secondary | ICD-10-CM

## 2012-12-21 DIAGNOSIS — S61219A Laceration without foreign body of unspecified finger without damage to nail, initial encounter: Secondary | ICD-10-CM

## 2012-12-21 DIAGNOSIS — S61209A Unspecified open wound of unspecified finger without damage to nail, initial encounter: Secondary | ICD-10-CM

## 2012-12-21 NOTE — Progress Notes (Signed)
   Subjective:    Patient ID: Barbara Bray, female    DOB: 1990/12/13, 22 y.o.   MRN: [id number]  Hand Pain  The incident occurred 1 to 3 hours ago. The incident occurred at work. Injury mechanism: she cut her left index finger with a knife. The quality of the pain is described as aching. The pain does not radiate. The pain is at a severity of 1/10. The pain is mild. The pain has been constant since the incident. Associated symptoms include muscle weakness. Pertinent negatives include no numbness or tingling. She has tried nothing for the symptoms.      Review of Systems  Neurological: Negative for tingling and numbness.  All other systems reviewed and are negative.       Objective:   Physical Exam  Musculoskeletal:       Left hand: She exhibits deformity. She exhibits normal range of motion, no tenderness, no bony tenderness, normal two-point discrimination, normal capillary refill, no laceration and no swelling. Normal sensation noted. Decreased sensation is not present in the ulnar distribution and is not present in the medial redistribution. Normal strength noted.       Hands: The LIF was cleaned with betadine then prepped and draped in sterile fashion. Local anesthesia was obtained with 0.5% plain marcaine - 1 cc was used. The wound was then cleaned and irrigated and no additional injuries were seen. The edges were approximated with 2 simple interrupted sutures using 6.0 nylon on a pc-3. The length is 1.5 cm. The approximation is good. She continues to have good sensation and capillary refill. Neosporin and a dressing/splint were applied.    Lab Results  Component Value Date   WBC 7.9 08/18/2012   HGB 14.0 08/18/2012   HCT 41.5 08/18/2012   PLT 232.0 08/18/2012   GLUCOSE 106* 08/18/2012   CHOL 247* 08/18/2012   TRIG 113.0 08/18/2012   HDL 87.70 08/18/2012   LDLDIRECT 144.1 08/18/2012   ALT 8 08/18/2012   AST 17 08/18/2012   NA 138 08/18/2012   K 3.8 08/18/2012   CL 105 08/18/2012   CREATININE 0.8 08/18/2012   BUN 6 08/18/2012   CO2 23 08/18/2012   TSH 0.78 08/18/2012        Assessment & Plan:

## 2012-12-21 NOTE — Patient Instructions (Signed)

## 2013-01-01 ENCOUNTER — Ambulatory Visit (INDEPENDENT_AMBULATORY_CARE_PROVIDER_SITE_OTHER): Payer: BC Managed Care – PPO | Admitting: Internal Medicine

## 2013-01-01 ENCOUNTER — Encounter: Payer: Self-pay | Admitting: Internal Medicine

## 2013-01-01 VITALS — BP 120/78 | HR 83 | Temp 97.6°F | Resp 16

## 2013-01-01 DIAGNOSIS — M542 Cervicalgia: Secondary | ICD-10-CM

## 2013-01-01 DIAGNOSIS — F411 Generalized anxiety disorder: Secondary | ICD-10-CM

## 2013-01-01 MED ORDER — CLONAZEPAM 1 MG PO TABS: 1.0000 mg | ORAL_TABLET | Freq: Two times a day (BID) | ORAL | Status: AC | PRN

## 2013-01-01 MED ORDER — NAPROXEN 375 MG PO TABS: 375.0000 mg | ORAL_TABLET | Freq: Two times a day (BID) | ORAL | Status: DC

## 2013-01-01 NOTE — Patient Instructions (Signed)
Wound Care Wound care helps prevent pain and infection.  You may need a tetanus shot if:  You cannot remember when you had your last tetanus shot.  You have never had a tetanus shot.  The injury broke your skin. If you need a tetanus shot and you choose not to have one, you may get tetanus. Sickness from tetanus can be serious. HOME CARE   Only take medicine as told by your doctor.  Clean the wound daily with mild soap and water.  Change any bandages (dressings) as told by your doctor.  Put medicated cream and a bandage on the wound as told by your doctor.  Change the bandage if it gets wet, dirty, or starts to smell.  Take showers. Do not take baths, swim, or do anything that puts your wound under water.  Rest and raise (elevate) the wound until the pain and puffiness (swelling) are better.  Keep all doctor visits as told. GET HELP RIGHT AWAY IF:   Yellowish-white fluid (pus) comes from the wound.  Medicine does not lessen your pain.  There is a red streak going away from the wound.  You have a fever. MAKE SURE YOU:   Understand these instructions.  Will watch your condition.  Will get help right away if you are not doing well or get worse. Document Released: 10/03/2007 Document Revised: 03/18/2011 Document Reviewed: 04/29/2010 [employer] Patient Information 2014 [employer].  

## 2013-01-01 NOTE — Assessment & Plan Note (Signed)
She is getting pain relief with nsaid therapy

## 2013-01-01 NOTE — Assessment & Plan Note (Signed)
She will continue klonopin as needed 

## 2013-01-01 NOTE — Progress Notes (Signed)
   Subjective:    Patient ID: Barbara Bray, female    DOB: 22-Nov-1990, 22 y.o.   MRN: [id number]  Wound Check She was originally treated 10 to 14 days ago. Previous treatment included laceration repair. There has been no drainage from the wound. There is no redness present. There is no swelling present. The pain has no pain. She has no difficulty moving the affected extremity or digit.      Review of Systems  Constitutional: Negative.   HENT: Negative.   Eyes: Negative.   Respiratory: Negative.   Cardiovascular: Negative.  Negative for chest pain, palpitations and leg swelling.  Gastrointestinal: Negative.   Endocrine: Negative.   Genitourinary: Negative.   Musculoskeletal: Positive for neck pain. Negative for arthralgias, back pain, gait problem, joint swelling, myalgias and neck stiffness.  Skin: Negative.   Allergic/Immunologic: Negative.   Neurological: Negative.   Hematological: Negative.  Negative for adenopathy. Does not bruise/bleed easily.  Psychiatric/Behavioral: Positive for sleep disturbance. Negative for suicidal ideas, hallucinations, behavioral problems, confusion, self-injury, dysphoric mood, decreased concentration and agitation. The patient is nervous/anxious. The patient is not hyperactive.        Objective:   Physical Exam  Vitals reviewed. Constitutional: She is oriented to person, place, and time. She appears well-developed and well-nourished. No distress.  HENT:  Head: Normocephalic and atraumatic.  Mouth/Throat: Oropharynx is clear and moist. No oropharyngeal exudate.  Eyes: Conjunctivae are normal. Right eye exhibits no discharge. Left eye exhibits no discharge. No scleral icterus.  Neck: Normal range of motion. Neck supple. No JVD present. No tracheal deviation present. No thyromegaly present.  Cardiovascular: Normal rate, regular rhythm, normal heart sounds and intact distal pulses.  Exam reveals no gallop and no friction rub.   No murmur  heard. Pulmonary/Chest: Effort normal and breath sounds normal. No stridor. No respiratory distress. She has no wheezes. She has no rales. She exhibits no tenderness.  Abdominal: Soft. Bowel sounds are normal. She exhibits no distension and no mass. There is no tenderness. There is no rebound and no guarding.  Musculoskeletal: Normal range of motion. She exhibits no edema and no tenderness.       Cervical back: Normal. She exhibits normal range of motion, no tenderness, no bony tenderness, no swelling, no edema, no deformity, no laceration, no pain, no spasm and normal pulse.       Left hand: Normal. She exhibits normal range of motion, no tenderness, no bony tenderness, normal two-point discrimination, no deformity, no laceration and no swelling. Normal sensation noted. Decreased sensation is not present in the ulnar distribution and is not present in the radial distribution. Normal strength noted. She exhibits no finger abduction, no thumb/finger opposition and no wrist extension trouble.       Hands: Lymphadenopathy:    She has no cervical adenopathy.  Neurological: She is alert and oriented to person, place, and time. She has normal reflexes. She displays normal reflexes. No cranial nerve deficit. She exhibits normal muscle tone. Coordination normal.  Skin: Skin is warm and dry. No rash noted. She is not diaphoretic. No erythema. No pallor.  Psychiatric: She has a normal mood and affect. Her behavior is normal. Thought content normal.          Assessment & Plan:

## 2013-02-10 ENCOUNTER — Ambulatory Visit (INDEPENDENT_AMBULATORY_CARE_PROVIDER_SITE_OTHER): Payer: BC Managed Care – PPO | Admitting: Internal Medicine

## 2013-02-10 ENCOUNTER — Encounter: Payer: Self-pay | Admitting: Internal Medicine

## 2013-02-10 VITALS — BP 106/66 | HR 89 | Temp 98.2°F | Resp 16 | Ht 63.0 in | Wt 131.0 lb

## 2013-02-10 DIAGNOSIS — M79641 Pain in right hand: Secondary | ICD-10-CM

## 2013-02-10 DIAGNOSIS — H6980 Other specified disorders of Eustachian tube, unspecified ear: Secondary | ICD-10-CM | POA: Insufficient documentation

## 2013-02-10 DIAGNOSIS — M79642 Pain in left hand: Principal | ICD-10-CM | POA: Insufficient documentation

## 2013-02-10 DIAGNOSIS — M79609 Pain in unspecified limb: Secondary | ICD-10-CM

## 2013-02-10 MED ORDER — METHYLPREDNISOLONE 4 MG PO KIT: PACK | ORAL | Status: DC

## 2013-02-10 MED ORDER — CETIRIZINE-PSEUDOEPHEDRINE ER 5-120 MG PO TB12: 1.0000 | ORAL_TABLET | Freq: Two times a day (BID) | ORAL | Status: DC

## 2013-02-10 NOTE — Progress Notes (Signed)
Subjective:    Patient ID: Barbara InglesHilary Scally, female    DOB: 01/29/1990, 23 y.o.   MRN: [id number]  HPI  She complains of a one week history of pressure, popping, tinnitus and hyperacusis in her left ear.  Review of Systems  Constitutional: Negative.  Negative for fever, chills, diaphoresis, appetite change and fatigue.  HENT: Positive for ear pain and tinnitus. Negative for congestion, dental problem, facial swelling, hearing loss, mouth sores, nosebleeds, postnasal drip, rhinorrhea, sinus pressure, sneezing, sore throat, trouble swallowing and voice change.   Eyes: Negative.   Respiratory: Negative.  Negative for cough, chest tightness, shortness of breath, wheezing and stridor.   Cardiovascular: Negative.  Negative for chest pain, palpitations and leg swelling.  Gastrointestinal: Negative.  Negative for nausea, vomiting, abdominal pain and diarrhea.  Endocrine: Negative.   Genitourinary: Negative.   Musculoskeletal: Positive for arthralgias (she has aching in both hands associated wth her daily activitis). Negative for back pain, gait problem, joint swelling, myalgias, neck pain and neck stiffness.  Skin: Negative.   Allergic/Immunologic: Negative.   Neurological: Negative.   Hematological: Negative.  Negative for adenopathy. Does not bruise/bleed easily.  Psychiatric/Behavioral: Negative.        Objective:   Physical Exam  Vitals reviewed. Constitutional: She is oriented to person, place, and time. She appears well-developed and well-nourished.  Non-toxic appearance. She does not have a sickly appearance. She does not appear ill. No distress.  HENT:  Head: Normocephalic and atraumatic.  Nose: No mucosal edema, rhinorrhea or sinus tenderness. No epistaxis. Right sinus exhibits no maxillary sinus tenderness and no frontal sinus tenderness. Left sinus exhibits no maxillary sinus tenderness and no frontal sinus tenderness.  Mouth/Throat: Oropharynx is clear and moist and mucous membranes  are normal. Mucous membranes are not pale, not dry and not cyanotic. No oral lesions. No trismus in the jaw. No uvula swelling. No oropharyngeal exudate, posterior oropharyngeal edema, posterior oropharyngeal erythema or tonsillar abscesses.  Eyes: Conjunctivae are normal. Right eye exhibits no discharge. Left eye exhibits no discharge. No scleral icterus.  Neck: Normal range of motion. Neck supple. No JVD present. No tracheal deviation present. No thyromegaly present.  Cardiovascular: Normal rate, regular rhythm, normal heart sounds and intact distal pulses.  Exam reveals no gallop and no friction rub.   No murmur heard. Pulmonary/Chest: Effort normal and breath sounds normal. No stridor. No respiratory distress. She has no wheezes. She has no rales. She exhibits no tenderness.  Abdominal: Soft. Bowel sounds are normal. She exhibits no distension and no mass. There is no tenderness. There is no rebound and no guarding.  Musculoskeletal: Normal range of motion. She exhibits no edema.       Right hand: Normal. She exhibits normal range of motion, no tenderness, no bony tenderness, normal two-point discrimination, normal capillary refill, no deformity, no laceration and no swelling. Normal sensation noted. Decreased sensation is not present in the ulnar distribution, is not present in the medial distribution and is not present in the radial distribution. Normal strength noted.       Left hand: Normal. She exhibits normal range of motion, no tenderness, no bony tenderness, normal two-point discrimination, normal capillary refill, no deformity, no laceration and no swelling. Normal sensation noted. Normal strength noted.  Lymphadenopathy:    She has no cervical adenopathy.  Neurological: She is oriented to person, place, and time.  Skin: Skin is warm and dry. No rash noted. She is not diaphoretic. No erythema. No pallor.  Psychiatric: She has  a normal mood and affect. Her behavior is normal. Judgment and  thought content normal.          Assessment & Plan:

## 2013-02-10 NOTE — Assessment & Plan Note (Signed)
Will treat this with medrol dose pak and zyrtec-d

## 2013-02-10 NOTE — Progress Notes (Signed)
Pre visit review using our clinic review tool, if applicable. No additional management support is needed unless otherwise documented below in the visit note. 

## 2013-02-10 NOTE — Patient Instructions (Signed)
Barotitis Media  Barotitis media is inflammation of your middle ear. This occurs when the auditory tube (eustachian tube) leading from the back of your nose (nasopharynx) to your eardrum is blocked. This blockage may result from a cold, environmental allergies, or an upper respiratory infection. Unresolved barotitis media may lead to damage or hearing loss (barotrauma), which may become permanent.  HOME CARE INSTRUCTIONS   · Use medicines as recommended by your health care provider. Over-the-counter medicines will help unblock the canal and can help during times of air travel.  · Do not put anything into your ears to clean or unplug them. Eardrops will not be helpful.  · Do not swim, dive, or fly until your health care provider says it is all right to do so. If these activities are necessary, chewing gum with frequent, forceful swallowing may help. It is also helpful to hold your nose and gently blow to pop your ears for equalizing pressure changes. This forces air into the eustachian tube.  · Only take over-the-counter or prescription medicines for pain, discomfort, or fever as directed by your health care provider.  · A decongestant may be helpful in decongesting the middle ear and make pressure equalization easier.  SEEK MEDICAL CARE IF:  · You experience a serious form of dizziness in which you feel as if the room is spinning and you feel nauseated (vertigo).  · Your symptoms only involve one ear.  SEEK IMMEDIATE MEDICAL CARE IF:   · You develop a severe headache, dizziness, or severe ear pain.  · You have bloody or pus-like drainage from your ears.  · You develop a fever.  · Your problems do not improve or become worse.  MAKE SURE YOU:   · Understand these instructions.  · Will watch your condition.  · Will get help right away if you are not doing well or get worse.  Document Released: 12/22/1999 Document Revised: 10/14/2012 Document Reviewed: 07/21/2012  [employer]® Patient Information ©2014 [employer].

## 2013-02-10 NOTE — Assessment & Plan Note (Signed)
The exam of her hands is normal and I previously checked her for CTS with NCS which was normal I have asked her to see sports med about this

## 2013-02-24 ENCOUNTER — Ambulatory Visit: Payer: BC Managed Care – PPO | Admitting: Family Medicine

## 2013-02-24 DIAGNOSIS — Z0289 Encounter for other administrative examinations: Secondary | ICD-10-CM

## 2013-06-15 ENCOUNTER — Other Ambulatory Visit: Payer: Self-pay | Admitting: Nurse Practitioner

## 2013-06-15 ENCOUNTER — Other Ambulatory Visit (HOSPITAL_COMMUNITY)
Admission: RE | Admit: 2013-06-15 | Discharge: 2013-06-15 | Disposition: A | Discharge status: Home / Self Care | Payer: BC Managed Care – PPO | Source: Ambulatory Visit | Attending: Nurse Practitioner | Admitting: Nurse Practitioner

## 2013-06-15 DIAGNOSIS — Z124 Encounter for screening for malignant neoplasm of cervix: Secondary | ICD-10-CM | POA: Insufficient documentation

## 2013-06-15 DIAGNOSIS — Z113 Encounter for screening for infections with a predominantly sexual mode of transmission: Secondary | ICD-10-CM | POA: Insufficient documentation

## 2013-06-16 LAB — CYTOLOGY - PAP

## 2013-07-22 ENCOUNTER — Telehealth: Payer: Self-pay | Admitting: Internal Medicine

## 2013-07-22 NOTE — Telephone Encounter (Signed)
Pt requesting TB skin test for work. Please advise, last ov 02/2013.

## 2013-07-22 NOTE — Telephone Encounter (Signed)
appt set 07/27/13

## 2013-07-27 ENCOUNTER — Ambulatory Visit (INDEPENDENT_AMBULATORY_CARE_PROVIDER_SITE_OTHER): Payer: BC Managed Care – PPO

## 2013-07-27 DIAGNOSIS — Z111 Encounter for screening for respiratory tuberculosis: Secondary | ICD-10-CM

## 2013-07-27 DIAGNOSIS — Z23 Encounter for immunization: Secondary | ICD-10-CM

## 2013-07-29 ENCOUNTER — Telehealth: Payer: Self-pay

## 2013-07-29 LAB — TB SKIN TEST
INDURATION: 0 mm
TB Skin Test: NEGATIVE

## 2013-07-29 NOTE — Telephone Encounter (Signed)
TB skin test was read and was negative.   Results faxed per pt request to [phone]  Copy of results given to pt as well

## 2013-08-19 ENCOUNTER — Ambulatory Visit (INDEPENDENT_AMBULATORY_CARE_PROVIDER_SITE_OTHER): Payer: BC Managed Care – PPO | Admitting: Internal Medicine

## 2013-08-19 ENCOUNTER — Encounter: Payer: Self-pay | Admitting: Internal Medicine

## 2013-08-19 VITALS — BP 120/66 | HR 80 | Temp 98.5°F | Resp 16 | Ht 63.0 in | Wt 132.0 lb

## 2013-08-19 DIAGNOSIS — H6982 Other specified disorders of Eustachian tube, left ear: Secondary | ICD-10-CM

## 2013-08-19 DIAGNOSIS — J019 Acute sinusitis, unspecified: Secondary | ICD-10-CM | POA: Insufficient documentation

## 2013-08-19 DIAGNOSIS — H6980 Other specified disorders of Eustachian tube, unspecified ear: Secondary | ICD-10-CM

## 2013-08-19 DIAGNOSIS — J45909 Unspecified asthma, uncomplicated: Secondary | ICD-10-CM

## 2013-08-19 DIAGNOSIS — J309 Allergic rhinitis, unspecified: Secondary | ICD-10-CM | POA: Insufficient documentation

## 2013-08-19 DIAGNOSIS — J4530 Mild persistent asthma, uncomplicated: Secondary | ICD-10-CM

## 2013-08-19 MED ORDER — CEFUROXIME AXETIL 500 MG PO TABS: 500.0000 mg | ORAL_TABLET | Freq: Two times a day (BID) | ORAL | Status: DC

## 2013-08-19 MED ORDER — ALBUTEROL SULFATE 108 (90 BASE) MCG/ACT IN AEPB
1.0000 | INHALATION_SPRAY | Freq: Four times a day (QID) | RESPIRATORY_TRACT | Status: DC | PRN
Start: 2013-08-19 — End: 2016-08-22

## 2013-08-19 MED ORDER — BECLOMETHASONE DIPROPIONATE 80 MCG/ACT NA AERS: 4.0000 | INHALATION_SPRAY | Freq: Every day | NASAL | Status: DC

## 2013-08-19 MED ORDER — CETIRIZINE-PSEUDOEPHEDRINE ER 5-120 MG PO TB12: 1.0000 | ORAL_TABLET | Freq: Two times a day (BID) | ORAL | Status: DC

## 2013-08-19 NOTE — Assessment & Plan Note (Signed)
She is doing well with prn albuterol usage

## 2013-08-19 NOTE — Patient Instructions (Signed)

## 2013-08-19 NOTE — Assessment & Plan Note (Signed)
Will restart zyrtec-d and start qnasl for this

## 2013-08-19 NOTE — Assessment & Plan Note (Signed)
Will treat the infection with ceftin 

## 2013-08-19 NOTE — Progress Notes (Signed)
Subjective:    Patient ID: Barbara Bray, female    DOB: 07/23/1990, 23 y.o.   MRN: [id number]  Sinusitis This is a new problem. The current episode started in the past 7 days. The problem has been gradually worsening since onset. There has been no fever. The fever has been present for less than 1 day. Her pain is at a severity of 0/10. She is experiencing no pain. Associated symptoms include congestion, ear pain (left ear pressure), sinus pressure, sneezing and a sore throat. Pertinent negatives include no chills, coughing, diaphoresis, headaches, hoarse voice, neck pain, shortness of breath or swollen glands. Past treatments include nothing. The treatment provided no relief.      Review of Systems  Constitutional: Negative.  Negative for fever, chills, diaphoresis, appetite change and fatigue.  HENT: Positive for congestion, ear pain (left ear pressure), postnasal drip, rhinorrhea, sinus pressure, sneezing and sore throat. Negative for dental problem, drooling, ear discharge, hoarse voice, nosebleeds, tinnitus, trouble swallowing and voice change.   Eyes: Negative.   Respiratory: Positive for wheezing (rare wheezing). Negative for apnea, cough, choking, chest tightness, shortness of breath and stridor.   Cardiovascular: Negative.  Negative for chest pain, palpitations and leg swelling.  Gastrointestinal: Negative.  Negative for nausea, vomiting, abdominal pain, diarrhea, constipation and blood in stool.  Endocrine: Negative.   Genitourinary: Negative.   Musculoskeletal: Negative.  Negative for neck pain.  Skin: Negative.   Allergic/Immunologic: Negative.   Neurological: Negative.  Negative for headaches.  Hematological: Negative.  Negative for adenopathy. Does not bruise/bleed easily.  Psychiatric/Behavioral: Negative.        Objective:   Physical Exam  Vitals reviewed. Constitutional: She is oriented to person, place, and time. She appears well-developed and well-nourished.   Non-toxic appearance. She does not have a sickly appearance. She does not appear ill. No distress.  HENT:  Head: Normocephalic and atraumatic.  Right Ear: Hearing, tympanic membrane, external ear and ear canal normal.  Left Ear: Hearing, tympanic membrane, external ear and ear canal normal.  Nose: Mucosal edema and rhinorrhea present. No sinus tenderness or nasal deformity. No epistaxis. Right sinus exhibits no maxillary sinus tenderness and no frontal sinus tenderness. Left sinus exhibits maxillary sinus tenderness. Left sinus exhibits no frontal sinus tenderness.  Mouth/Throat: Oropharynx is clear and moist and mucous membranes are normal. Mucous membranes are not pale, not dry and not cyanotic. No oral lesions. No trismus in the jaw. No uvula swelling. No oropharyngeal exudate, posterior oropharyngeal edema, posterior oropharyngeal erythema or tonsillar abscesses.  Eyes: Conjunctivae are normal. Right eye exhibits no discharge. Left eye exhibits no discharge. No scleral icterus.  Neck: Normal range of motion. Neck supple. No JVD present. No tracheal deviation present. No thyromegaly present.  Cardiovascular: Normal rate, regular rhythm, normal heart sounds and intact distal pulses.  Exam reveals no gallop and no friction rub.   No murmur heard. Pulmonary/Chest: Effort normal and breath sounds normal. No stridor. No respiratory distress. She has no wheezes. She has no rales. She exhibits no tenderness.  Abdominal: Soft. Bowel sounds are normal. She exhibits no distension and no mass. There is no tenderness. There is no rebound and no guarding.  Musculoskeletal: Normal range of motion. She exhibits no edema and no tenderness.  Lymphadenopathy:    She has no cervical adenopathy.  Neurological: She is oriented to person, place, and time.  Skin: Skin is warm and dry. No rash noted. She is not diaphoretic. No erythema. No pallor.  Assessment & Plan:

## 2013-08-19 NOTE — Assessment & Plan Note (Signed)
Will treat with zyrtec-d and qnasl

## 2013-08-19 NOTE — Progress Notes (Signed)
Pre visit review using our clinic review tool, if applicable. No additional management support is needed unless otherwise documented below in the visit note. 

## 2013-09-25 ENCOUNTER — Encounter: Payer: Self-pay | Admitting: Family Medicine

## 2013-09-25 ENCOUNTER — Ambulatory Visit (INDEPENDENT_AMBULATORY_CARE_PROVIDER_SITE_OTHER): Payer: BC Managed Care – PPO | Admitting: Family Medicine

## 2013-09-25 ENCOUNTER — Ambulatory Visit: Payer: Self-pay | Admitting: Family Medicine

## 2013-09-25 VITALS — BP 120/88 | Temp 98.5°F | Wt 132.0 lb

## 2013-09-25 DIAGNOSIS — K047 Periapical abscess without sinus: Secondary | ICD-10-CM

## 2013-09-25 MED ORDER — AMOXICILLIN-POT CLAVULANATE 875-125 MG PO TABS: 1.0000 | ORAL_TABLET | Freq: Two times a day (BID) | ORAL | Status: DC

## 2013-09-25 NOTE — Assessment & Plan Note (Addendum)
With swollen submandibular LN Cover with augmentin  Update if not starting to improve in a week or if worsening   She will get into a dentist asap Has not been able to afford root canal in the past

## 2013-09-25 NOTE — Patient Instructions (Signed)
See a dentist when you can  Take the augmentin as directed  Use a warm compress on the sore area  If suddenly worse/fever / bad pain = go to the ER
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
## 2013-10-22 ENCOUNTER — Other Ambulatory Visit: Payer: Self-pay

## 2013-11-03 ENCOUNTER — Encounter: Payer: Self-pay | Admitting: Family

## 2013-11-03 ENCOUNTER — Ambulatory Visit (INDEPENDENT_AMBULATORY_CARE_PROVIDER_SITE_OTHER): Payer: BC Managed Care – PPO | Admitting: Family

## 2013-11-03 VITALS — BP 124/82 | HR 118 | Temp 98.6°F | Resp 18 | Ht 63.0 in | Wt 127.4 lb

## 2013-11-03 DIAGNOSIS — J069 Acute upper respiratory infection, unspecified: Secondary | ICD-10-CM

## 2013-11-03 MED ORDER — AZITHROMYCIN 250 MG PO TABS: ORAL_TABLET | ORAL | Status: DC

## 2013-11-03 NOTE — Progress Notes (Signed)
   Subjective:    Patient ID: Barbara Bray, female    DOB: 06/29/1990, 23 y.o.   MRN: [id number]  Chief Complaint  Patient presents with  . Nasal Congestion    x4 days, cough, sore throat, a little wheezing    HPI:  Barbara Bray is a 23 y.o. female who presents today for nasal congestion.   Acute symptoms started on Saturday with general feeling of malaise. Has worsened to currently stuffiness, sore throat, cough, chest tightness, and has started coughing up green mucus. Has had a low grade fever. Denies any sinus pressure. Last fever was last night. Has attempted relief with Zyrtec, Tylenol cough and cold, Mucinex cought and flu which provided minimal relief. Has used her inhaler for wheezing which is increased over the last couple of days. Currently working in a daycare.   Allergies  Allergen Reactions  . Tramadol Nausea Only and Other (See Comments)    Anxiety   Current Outpatient Prescriptions on File Prior to Visit  Medication Sig Dispense Refill  . Albuterol Sulfate (PROAIR RESPICLICK) 108 (90 BASE) MCG/ACT AEPB Inhale 1 puff into the lungs 4 (four) times daily as needed.  1 each  11  . clonazePAM (KLONOPIN) 1 MG tablet Take 1 tablet (1 mg total) by mouth 2 (two) times daily as needed.  50 tablet  3  . drospirenone-ethinyl estradiol (YAZ,GIANVI,LORYNA) 3-0.02 MG tablet Take 1 tablet by mouth daily.       No current facility-administered medications on file prior to visit.   Review of Systems    See HPI Objective:    BP 124/82  Pulse 118  Temp(Src) 98.6 F (37 C) (Oral)  Resp 18  Ht 5\' 3"  (1.6 m)  Wt 127 lb 6.4 oz (57.788 kg)  BMI 22.57 kg/m2  SpO2 98% Nursing note and vital signs reviewed.  Physical Exam  Constitutional: She is oriented to person, place, and time. She appears well-developed and well-nourished. No distress.  HENT:  Right Ear: Hearing, tympanic membrane, external ear and ear canal normal.  Left Ear: Hearing, tympanic membrane, external ear and ear  canal normal.  Nose: Right sinus exhibits no maxillary sinus tenderness and no frontal sinus tenderness. Left sinus exhibits no maxillary sinus tenderness and no frontal sinus tenderness.  Mouth/Throat: Uvula is midline and mucous membranes are normal. Posterior oropharyngeal erythema present.  Neck: Neck supple.  Cardiovascular: Normal rate, regular rhythm and normal heart sounds.   Pulmonary/Chest: Effort normal and breath sounds normal.  Lymphadenopathy:    She has no cervical adenopathy.  Neurological: She is alert and oriented to person, place, and time.  Skin: Skin is warm and dry.  Psychiatric: She has a normal mood and affect. Her behavior is normal. Judgment and thought content normal.       Assessment & Plan:

## 2013-11-03 NOTE — Patient Instructions (Addendum)
Thank you for choosing [facility].  Summary/Instructions:   Your antibiotic has been sent the to the pharmacy  Please continue over the counter medicines. Please try Flonase for congestion, Tylenol for fever and throat soreness, Cepacol losenges or spray for throat soreness, Sudafed for decongestant.   Drink lots of fluids; sinus nasal sprays and netti pot as needed.  If you don't see any improvements in the next 3-4 days please let us know.

## 2013-11-03 NOTE — Assessment & Plan Note (Signed)
Symptoms consistent with upper respiratory infection potentially bacterial. 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 Continue over the counter medications for symptom relief. Follow up if symptoms worsen or fail to improve.

## 2013-11-03 NOTE — Progress Notes (Signed)
Pre visit review using our clinic review tool, if applicable. No additional management support is needed unless otherwise documented below in the visit note. 

## 2013-11-08 ENCOUNTER — Other Ambulatory Visit (INDEPENDENT_AMBULATORY_CARE_PROVIDER_SITE_OTHER): Payer: BC Managed Care – PPO

## 2013-11-08 ENCOUNTER — Ambulatory Visit (INDEPENDENT_AMBULATORY_CARE_PROVIDER_SITE_OTHER): Payer: BC Managed Care – PPO | Admitting: Internal Medicine

## 2013-11-08 ENCOUNTER — Encounter: Payer: Self-pay | Admitting: Internal Medicine

## 2013-11-08 VITALS — BP 112/62 | HR 92 | Temp 98.2°F | Resp 12 | Ht 63.0 in | Wt 138.0 lb

## 2013-11-08 DIAGNOSIS — Z0000 Encounter for general adult medical examination without abnormal findings: Secondary | ICD-10-CM

## 2013-11-08 LAB — COMPREHENSIVE METABOLIC PANEL
ALK PHOS: 64 U/L (ref 39–117)
ALT: 13 U/L (ref 0–35)
AST: 19 U/L (ref 0–37)
Albumin: 3.6 g/dL (ref 3.5–5.2)
BUN: 8 mg/dL (ref 6–23)
CO2: 22 mEq/L (ref 19–32)
CREATININE: 0.7 mg/dL (ref 0.4–1.2)
Calcium: 9.3 mg/dL (ref 8.4–10.5)
Chloride: 105 mEq/L (ref 96–112)
GFR: 110.07 mL/min (ref >60.00)
Glucose, Bld: 82 mg/dL (ref 70–99)
POTASSIUM: 4.2 meq/L (ref 3.5–5.1)
Sodium: 137 mEq/L (ref 135–145)
Total Bilirubin: 0.3 mg/dL (ref 0.2–1.2)
Total Protein: 8 g/dL (ref 6.0–8.3)

## 2013-11-08 LAB — CBC WITH DIFFERENTIAL/PLATELET
BASOS PCT: 0.7 % (ref 0.0–3.0)
Basophils Absolute: 0.1 10*3/uL (ref 0.0–0.1)
EOS ABS: 0.2 10*3/uL (ref 0.0–0.7)
Eosinophils Relative: 2.3 % (ref 0.0–5.0)
HCT: 39.5 % (ref 36.0–46.0)
Hemoglobin: 13.3 g/dL (ref 12.0–15.0)
Lymphocytes Relative: 38.2 % (ref 12.0–46.0)
Lymphs Abs: 3.7 10*3/uL (ref 0.7–4.0)
MCHC: 33.6 g/dL (ref 30.0–36.0)
MCV: 89.9 fl (ref 78.0–100.0)
Monocytes Absolute: 0.9 10*3/uL (ref 0.1–1.0)
Monocytes Relative: 9.3 % (ref 3.0–12.0)
NEUTROS ABS: 4.8 10*3/uL (ref 1.4–7.7)
NEUTROS PCT: 49.5 % (ref 43.0–77.0)
Platelets: 354 10*3/uL (ref 150.0–400.0)
RBC: 4.39 Mil/uL (ref 3.87–5.11)
RDW: 12.3 % (ref 11.5–15.5)
WBC: 9.7 10*3/uL (ref 4.0–10.5)

## 2013-11-08 LAB — LIPID PANEL
CHOLESTEROL: 289 mg/dL — AB (ref 0–200)
HDL: 94 mg/dL (ref >39.00)
LDL CALC: 174 mg/dL — AB (ref 0–99)
NonHDL: 195
Total CHOL/HDL Ratio: 3
Triglycerides: 103 mg/dL (ref 0.0–149.0)
VLDL: 20.6 mg/dL (ref 0.0–40.0)

## 2013-11-08 LAB — T3, FREE: T3, Free: 2.9 pg/mL (ref 2.3–4.2)

## 2013-11-08 LAB — TSH: TSH: 1.11 u[IU]/mL (ref 0.35–4.50)

## 2013-11-08 LAB — T4: T4 TOTAL: 11.1 ug/dL (ref 4.5–12.0)

## 2013-11-08 NOTE — Patient Instructions (Signed)
Preventive Care for Adults A healthy lifestyle and preventive care can promote health and wellness. Preventive health guidelines for women include the following key practices.  A routine yearly physical is a good way to check with your health care provider about your health and preventive screening. It is a chance to share any concerns and updates on your health and to receive a thorough exam.  Visit your dentist for a routine exam and preventive care every 6 months. Brush your teeth twice a day and floss once a day. Good oral hygiene prevents tooth decay and gum disease.  The frequency of eye exams is based on your age, health, family medical history, use of contact lenses, and other factors. Follow your health care provider's recommendations for frequency of eye exams.  Eat a healthy diet. Foods like vegetables, fruits, whole grains, low-fat dairy products, and lean protein foods contain the nutrients you need without too many calories. Decrease your intake of foods high in solid fats, added sugars, and salt. Eat the right amount of calories for you.Get information about a proper diet from your health care provider, if necessary.  Regular physical exercise is one of the most important things you can do for your health. Most adults should get at least 150 minutes of moderate-intensity exercise (any activity that increases your heart rate and causes you to sweat) each week. In addition, most adults need muscle-strengthening exercises on 2 or more days a week.  Maintain a healthy weight. The body mass index (BMI) is a screening tool to identify possible weight problems. It provides an estimate of body fat based on height and weight. Your health care provider can find your BMI and can help you achieve or maintain a healthy weight.For adults 20 years and older:  A BMI below 18.5 is considered underweight.  A BMI of 18.5 to 24.9 is normal.  A BMI of 25 to 29.9 is considered overweight.  A BMI of  30 and above is considered obese.  Maintain normal blood lipids and cholesterol levels by exercising and minimizing your intake of saturated fat. Eat a balanced diet with plenty of fruit and vegetables. Blood tests for lipids and cholesterol should begin at age 76 and be repeated every 5 years. If your lipid or cholesterol levels are high, you are over 50, or you are at high risk for heart disease, you may need your cholesterol levels checked more frequently.Ongoing high lipid and cholesterol levels should be treated with medicines if diet and exercise are not working.  If you smoke, find out from your health care provider how to quit. If you do not use tobacco, do not start.  Lung cancer screening is recommended for adults aged 22-80 years who are at high risk for developing lung cancer because of a history of smoking. A yearly low-dose CT scan of the lungs is recommended for people who have at least a 30-pack-year history of smoking and are a current smoker or have quit within the past 15 years. A pack year of smoking is smoking an average of 1 pack of cigarettes a day for 1 year (for example: 1 pack a day for 30 years or 2 packs a day for 15 years). Yearly screening should continue until the smoker has stopped smoking for at least 15 years. Yearly screening should be stopped for people who develop a health problem that would prevent them from having lung cancer treatment.  If you are pregnant, do not drink alcohol. If you are breastfeeding,  be very cautious about drinking alcohol. If you are not pregnant and choose to drink alcohol, do not have more than 1 drink per day. One drink is considered to be 12 ounces (355 mL) of beer, 5 ounces (148 mL) of wine, or 1.5 ounces (44 mL) of liquor.  Avoid use of street drugs. Do not share needles with anyone. Ask for help if you need support or instructions about stopping the use of drugs.  High blood pressure causes heart disease and increases the risk of  stroke. Your blood pressure should be checked at least every 1 to 2 years. Ongoing high blood pressure should be treated with medicines if weight loss and exercise do not work.  If you are 75-52 years old, ask your health care provider if you should take aspirin to prevent strokes.  Diabetes screening involves taking a blood sample to check your fasting blood sugar level. This should be done once every 3 years, after age 15, if you are within normal weight and without risk factors for diabetes. Testing should be considered at a younger age or be carried out more frequently if you are overweight and have at least 1 risk factor for diabetes.  Breast cancer screening is essential preventive care for women. You should practice "breast self-awareness." This means understanding the normal appearance and feel of your breasts and may include breast self-examination. Any changes detected, no matter how small, should be reported to a health care provider. Women in their 58s and 30s should have a clinical breast exam (CBE) by a health care provider as part of a regular health exam every 1 to 3 years. After age 16, women should have a CBE every year. Starting at age 53, women should consider having a mammogram (breast X-ray test) every year. Women who have a family history of breast cancer should talk to their health care provider about genetic screening. Women at a high risk of breast cancer should talk to their health care providers about having an MRI and a mammogram every year.  Breast cancer gene (BRCA)-related cancer risk assessment is recommended for women who have family members with BRCA-related cancers. BRCA-related cancers include breast, ovarian, tubal, and peritoneal cancers. Having family members with these cancers may be associated with an increased risk for harmful changes (mutations) in the breast cancer genes BRCA1 and BRCA2. Results of the assessment will determine the need for genetic counseling and  BRCA1 and BRCA2 testing.  Routine pelvic exams to screen for cancer are no longer recommended for nonpregnant women who are considered low risk for cancer of the pelvic organs (ovaries, uterus, and vagina) and who do not have symptoms. Ask your health care provider if a screening pelvic exam is right for you.  If you have had past treatment for cervical cancer or a condition that could lead to cancer, you need Pap tests and screening for cancer for at least 20 years after your treatment. If Pap tests have been discontinued, your risk factors (such as having a new sexual partner) need to be reassessed to determine if screening should be resumed. Some women have medical problems that increase the chance of getting cervical cancer. In these cases, your health care provider may recommend more frequent screening and Pap tests.  The HPV test is an additional test that may be used for cervical cancer screening. The HPV test looks for the virus that can cause the cell changes on the cervix. The cells collected during the Pap test can be  tested for HPV. The HPV test could be used to screen women aged 30 years and older, and should be used in women of any age who have unclear Pap test results. After the age of 30, women should have HPV testing at the same frequency as a Pap test.  Colorectal cancer can be detected and often prevented. Most routine colorectal cancer screening begins at the age of 50 years and continues through age 75 years. However, your health care provider may recommend screening at an earlier age if you have risk factors for colon cancer. On a yearly basis, your health care provider may provide home test kits to check for hidden blood in the stool. Use of a small camera at the end of a tube, to directly examine the colon (sigmoidoscopy or colonoscopy), can detect the earliest forms of colorectal cancer. Talk to your health care provider about this at age 50, when routine screening begins. Direct  exam of the colon should be repeated every 5-10 years through age 75 years, unless early forms of pre-cancerous polyps or small growths are found.  People who are at an increased risk for hepatitis B should be screened for this virus. You are considered at high risk for hepatitis B if:  You were born in a country where hepatitis B occurs often. Talk with your health care provider about which countries are considered high risk.  Your parents were born in a high-risk country and you have not received a shot to protect against hepatitis B (hepatitis B vaccine).  You have HIV or AIDS.  You use needles to inject street drugs.  You live with, or have sex with, someone who has hepatitis B.  You get hemodialysis treatment.  You take certain medicines for conditions like cancer, organ transplantation, and autoimmune conditions.  Hepatitis C blood testing is recommended for all people born from 1945 through 1965 and any individual with known risks for hepatitis C.  Practice safe sex. Use condoms and avoid high-risk sexual practices to reduce the spread of sexually transmitted infections (STIs). STIs include gonorrhea, chlamydia, syphilis, trichomonas, herpes, HPV, and human immunodeficiency virus (HIV). Herpes, HIV, and HPV are viral illnesses that have no cure. They can result in disability, cancer, and death.  You should be screened for sexually transmitted illnesses (STIs) including gonorrhea and chlamydia if:  You are sexually active and are younger than 24 years.  You are older than 24 years and your health care provider tells you that you are at risk for this type of infection.  Your sexual activity has changed since you were last screened and you are at an increased risk for chlamydia or gonorrhea. Ask your health care provider if you are at risk.  If you are at risk of being infected with HIV, it is recommended that you take a prescription medicine daily to prevent HIV infection. This is  called preexposure prophylaxis (PrEP). You are considered at risk if:  You are a heterosexual woman, are sexually active, and are at increased risk for HIV infection.  You take drugs by injection.  You are sexually active with a partner who has HIV.  Talk with your health care provider about whether you are at high risk of being infected with HIV. If you choose to begin PrEP, you should first be tested for HIV. You should then be tested every 3 months for as long as you are taking PrEP.  Osteoporosis is a disease in which the bones lose minerals and strength   with aging. This can result in serious bone fractures or breaks. The risk of osteoporosis can be identified using a bone density scan. Women ages 65 years and over and women at risk for fractures or osteoporosis should discuss screening with their health care providers. Ask your health care provider whether you should take a calcium supplement or vitamin D to reduce the rate of osteoporosis.  Menopause can be associated with physical symptoms and risks. Hormone replacement therapy is available to decrease symptoms and risks. You should talk to your health care provider about whether hormone replacement therapy is right for you.  Use sunscreen. Apply sunscreen liberally and repeatedly throughout the day. You should seek shade when your shadow is shorter than you. Protect yourself by wearing long sleeves, pants, a wide-brimmed hat, and sunglasses year round, whenever you are outdoors.  Once a month, do a whole body skin exam, using a mirror to look at the skin on your back. Tell your health care provider of new moles, moles that have irregular borders, moles that are larger than a pencil eraser, or moles that have changed in shape or color.  Stay current with required vaccines (immunizations).  Influenza vaccine. All adults should be immunized every year.  Tetanus, diphtheria, and acellular pertussis (Td, Tdap) vaccine. Pregnant women should  receive 1 dose of Tdap vaccine during each pregnancy. The dose should be obtained regardless of the length of time since the last dose. Immunization is preferred during the 27th-36th week of gestation. An adult who has not previously received Tdap or who does not know her vaccine status should receive 1 dose of Tdap. This initial dose should be followed by tetanus and diphtheria toxoids (Td) booster doses every 10 years. Adults with an unknown or incomplete history of completing a 3-dose immunization series with Td-containing vaccines should begin or complete a primary immunization series including a Tdap dose. Adults should receive a Td booster every 10 years.  Varicella vaccine. An adult without evidence of immunity to varicella should receive 2 doses or a second dose if she has previously received 1 dose. Pregnant females who do not have evidence of immunity should receive the first dose after pregnancy. This first dose should be obtained before leaving the health care facility. The second dose should be obtained 4-8 weeks after the first dose.  Human papillomavirus (HPV) vaccine. Females aged 13-26 years who have not received the vaccine previously should obtain the 3-dose series. The vaccine is not recommended for use in pregnant females. However, pregnancy testing is not needed before receiving a dose. If a female is found to be pregnant after receiving a dose, no treatment is needed. In that case, the remaining doses should be delayed until after the pregnancy. Immunization is recommended for any person with an immunocompromised condition through the age of 26 years if she did not get any or all doses earlier. During the 3-dose series, the second dose should be obtained 4-8 weeks after the first dose. The third dose should be obtained 24 weeks after the first dose and 16 weeks after the second dose.  Zoster vaccine. One dose is recommended for adults aged 60 years or older unless certain conditions are  present.  Measles, mumps, and rubella (MMR) vaccine. Adults born before 1957 generally are considered immune to measles and mumps. Adults born in 1957 or later should have 1 or more doses of MMR vaccine unless there is a contraindication to the vaccine or there is laboratory evidence of immunity to   each of the three diseases. A routine second dose of MMR vaccine should be obtained at least 28 days after the first dose for students attending postsecondary schools, health care workers, or international travelers. People who received inactivated measles vaccine or an unknown type of measles vaccine during 1963-1967 should receive 2 doses of MMR vaccine. People who received inactivated mumps vaccine or an unknown type of mumps vaccine before 1979 and are at high risk for mumps infection should consider immunization with 2 doses of MMR vaccine. For females of childbearing age, rubella immunity should be determined. If there is no evidence of immunity, females who are not pregnant should be vaccinated. If there is no evidence of immunity, females who are pregnant should delay immunization until after pregnancy. Unvaccinated health care workers born before 1957 who lack laboratory evidence of measles, mumps, or rubella immunity or laboratory confirmation of disease should consider measles and mumps immunization with 2 doses of MMR vaccine or rubella immunization with 1 dose of MMR vaccine.  Pneumococcal 13-valent conjugate (PCV13) vaccine. When indicated, a person who is uncertain of her immunization history and has no record of immunization should receive the PCV13 vaccine. An adult aged 19 years or older who has certain medical conditions and has not been previously immunized should receive 1 dose of PCV13 vaccine. This PCV13 should be followed with a dose of pneumococcal polysaccharide (PPSV23) vaccine. The PPSV23 vaccine dose should be obtained at least 8 weeks after the dose of PCV13 vaccine. An adult aged 19  years or older who has certain medical conditions and previously received 1 or more doses of PPSV23 vaccine should receive 1 dose of PCV13. The PCV13 vaccine dose should be obtained 1 or more years after the last PPSV23 vaccine dose.  Pneumococcal polysaccharide (PPSV23) vaccine. When PCV13 is also indicated, PCV13 should be obtained first. All adults aged 65 years and older should be immunized. An adult younger than age 65 years who has certain medical conditions should be immunized. Any person who resides in a nursing home or long-term care facility should be immunized. An adult smoker should be immunized. People with an immunocompromised condition and certain other conditions should receive both PCV13 and PPSV23 vaccines. People with human immunodeficiency virus (HIV) infection should be immunized as soon as possible after diagnosis. Immunization during chemotherapy or radiation therapy should be avoided. Routine use of PPSV23 vaccine is not recommended for American Indians, [place] Natives, or people younger than 65 years unless there are medical conditions that require PPSV23 vaccine. When indicated, people who have unknown immunization and have no record of immunization should receive PPSV23 vaccine. One-time revaccination 5 years after the first dose of PPSV23 is recommended for people aged 19-64 years who have chronic kidney failure, nephrotic syndrome, asplenia, or immunocompromised conditions. People who received 1-2 doses of PPSV23 before age 65 years should receive another dose of PPSV23 vaccine at age 65 years or later if at least 5 years have passed since the previous dose. Doses of PPSV23 are not needed for people immunized with PPSV23 at or after age 65 years.  Meningococcal vaccine. Adults with asplenia or persistent complement component deficiencies should receive 2 doses of quadrivalent meningococcal conjugate (MenACWY-D) vaccine. The doses should be obtained at least 2 months apart.  Microbiologists working with certain meningococcal bacteria, military recruits, people at risk during an outbreak, and people who travel to or live in countries with a high rate of meningitis should be immunized. A first-year college student up through age   21 years who is living in a residence hall should receive a dose if she did not receive a dose on or after her 16th birthday. Adults who have certain high-risk conditions should receive one or more doses of vaccine.  Hepatitis A vaccine. Adults who wish to be protected from this disease, have certain high-risk conditions, work with hepatitis A-infected animals, work in hepatitis A research labs, or travel to or work in countries with a high rate of hepatitis A should be immunized. Adults who were previously unvaccinated and who anticipate close contact with an international adoptee during the first 60 days after arrival in the [place] from a country with a high rate of hepatitis A should be immunized.  Hepatitis B vaccine. Adults who wish to be protected from this disease, have certain high-risk conditions, may be exposed to blood or other infectious body fluids, are household contacts or sex partners of hepatitis B positive people, are clients or workers in certain care facilities, or travel to or work in countries with a high rate of hepatitis B should be immunized.  Haemophilus influenzae type b (Hib) vaccine. A previously unvaccinated person with asplenia or sickle cell disease or having a scheduled splenectomy should receive 1 dose of Hib vaccine. Regardless of previous immunization, a recipient of a hematopoietic stem cell transplant should receive a 3-dose series 6-12 months after her successful transplant. Hib vaccine is not recommended for adults with HIV infection. Preventive Services / Frequency Ages 64 to 68 years  Blood pressure check.** / Every 1 to 2 years.  Lipid and cholesterol check.** / Every 5 years beginning at age  22.  Clinical breast exam.** / Every 3 years for women in their 88s and 53s.  BRCA-related cancer risk assessment.** / For women who have family members with a BRCA-related cancer (breast, ovarian, tubal, or peritoneal cancers).  Pap test.** / Every 2 years from ages 90 through 51. Every 3 years starting at age 21 through age 56 or 3 with a history of 3 consecutive normal Pap tests.  HPV screening.** / Every 3 years from ages 24 through ages 1 to 46 with a history of 3 consecutive normal Pap tests.  Hepatitis C blood test.** / For any individual with known risks for hepatitis C.  Skin self-exam. / Monthly.  Influenza vaccine. / Every year.  Tetanus, diphtheria, and acellular pertussis (Tdap, Td) vaccine.** / Consult your health care provider. Pregnant women should receive 1 dose of Tdap vaccine during each pregnancy. 1 dose of Td every 10 years.  Varicella vaccine.** / Consult your health care provider. Pregnant females who do not have evidence of immunity should receive the first dose after pregnancy.  HPV vaccine. / 3 doses over 6 months, if 72 and younger. The vaccine is not recommended for use in pregnant females. However, pregnancy testing is not needed before receiving a dose.  Measles, mumps, rubella (MMR) vaccine.** / You need at least 1 dose of MMR if you were born in 1957 or later. You may also need a 2nd dose. For females of childbearing age, rubella immunity should be determined. If there is no evidence of immunity, females who are not pregnant should be vaccinated. If there is no evidence of immunity, females who are pregnant should delay immunization until after pregnancy.  Pneumococcal 13-valent conjugate (PCV13) vaccine.** / Consult your health care provider.  Pneumococcal polysaccharide (PPSV23) vaccine.** / 1 to 2 doses if you smoke cigarettes or if you have certain conditions.  Meningococcal vaccine.** /  1 dose if you are age 19 to 21 years and a first-year college  student living in a residence hall, or have one of several medical conditions, you need to get vaccinated against meningococcal disease. You may also need additional booster doses.  Hepatitis A vaccine.** / Consult your health care provider.  Hepatitis B vaccine.** / Consult your health care provider.  Haemophilus influenzae type b (Hib) vaccine.** / Consult your health care provider. Ages 40 to 64 years  Blood pressure check.** / Every 1 to 2 years.  Lipid and cholesterol check.** / Every 5 years beginning at age 20 years.  Lung cancer screening. / Every year if you are aged 55-80 years and have a 30-pack-year history of smoking and currently smoke or have quit within the past 15 years. Yearly screening is stopped once you have quit smoking for at least 15 years or develop a health problem that would prevent you from having lung cancer treatment.  Clinical breast exam.** / Every year after age 40 years.  BRCA-related cancer risk assessment.** / For women who have family members with a BRCA-related cancer (breast, ovarian, tubal, or peritoneal cancers).  Mammogram.** / Every year beginning at age 40 years and continuing for as long as you are in good health. Consult with your health care provider.  Pap test.** / Every 3 years starting at age 30 years through age 65 or 70 years with a history of 3 consecutive normal Pap tests.  HPV screening.** / Every 3 years from ages 30 years through ages 65 to 70 years with a history of 3 consecutive normal Pap tests.  Fecal occult blood test (FOBT) of stool. / Every year beginning at age 50 years and continuing until age 75 years. You may not need to do this test if you get a colonoscopy every 10 years.  Flexible sigmoidoscopy or colonoscopy.** / Every 5 years for a flexible sigmoidoscopy or every 10 years for a colonoscopy beginning at age 50 years and continuing until age 75 years.  Hepatitis C blood test.** / For all people born from 1945 through  1965 and any individual with known risks for hepatitis C.  Skin self-exam. / Monthly.  Influenza vaccine. / Every year.  Tetanus, diphtheria, and acellular pertussis (Tdap/Td) vaccine.** / Consult your health care provider. Pregnant women should receive 1 dose of Tdap vaccine during each pregnancy. 1 dose of Td every 10 years.  Varicella vaccine.** / Consult your health care provider. Pregnant females who do not have evidence of immunity should receive the first dose after pregnancy.  Zoster vaccine.** / 1 dose for adults aged 60 years or older.  Measles, mumps, rubella (MMR) vaccine.** / You need at least 1 dose of MMR if you were born in 1957 or later. You may also need a 2nd dose. For females of childbearing age, rubella immunity should be determined. If there is no evidence of immunity, females who are not pregnant should be vaccinated. If there is no evidence of immunity, females who are pregnant should delay immunization until after pregnancy.  Pneumococcal 13-valent conjugate (PCV13) vaccine.** / Consult your health care provider.  Pneumococcal polysaccharide (PPSV23) vaccine.** / 1 to 2 doses if you smoke cigarettes or if you have certain conditions.  Meningococcal vaccine.** / Consult your health care provider.  Hepatitis A vaccine.** / Consult your health care provider.  Hepatitis B vaccine.** / Consult your health care provider.  Haemophilus influenzae type b (Hib) vaccine.** / Consult your health care provider. Ages 65   years and over  Blood pressure check.** / Every 1 to 2 years.  Lipid and cholesterol check.** / Every 5 years beginning at age 22 years.  Lung cancer screening. / Every year if you are aged 73-80 years and have a 30-pack-year history of smoking and currently smoke or have quit within the past 15 years. Yearly screening is stopped once you have quit smoking for at least 15 years or develop a health problem that would prevent you from having lung cancer  treatment.  Clinical breast exam.** / Every year after age 4 years.  BRCA-related cancer risk assessment.** / For women who have family members with a BRCA-related cancer (breast, ovarian, tubal, or peritoneal cancers).  Mammogram.** / Every year beginning at age 40 years and continuing for as long as you are in good health. Consult with your health care provider.  Pap test.** / Every 3 years starting at age 9 years through age 34 or 91 years with 3 consecutive normal Pap tests. Testing can be stopped between 65 and 70 years with 3 consecutive normal Pap tests and no abnormal Pap or HPV tests in the past 10 years.  HPV screening.** / Every 3 years from ages 57 years through ages 64 or 45 years with a history of 3 consecutive normal Pap tests. Testing can be stopped between 65 and 70 years with 3 consecutive normal Pap tests and no abnormal Pap or HPV tests in the past 10 years.  Fecal occult blood test (FOBT) of stool. / Every year beginning at age 15 years and continuing until age 17 years. You may not need to do this test if you get a colonoscopy every 10 years.  Flexible sigmoidoscopy or colonoscopy.** / Every 5 years for a flexible sigmoidoscopy or every 10 years for a colonoscopy beginning at age 86 years and continuing until age 71 years.  Hepatitis C blood test.** / For all people born from [birth year] through 1965 and any individual with known risks for hepatitis C.  Osteoporosis screening.** / A one-time screening for women ages 83 years and over and women at risk for fractures or osteoporosis.  Skin self-exam. / Monthly.  Influenza vaccine. / Every year.  Tetanus, diphtheria, and acellular pertussis (Tdap/Td) vaccine.** / 1 dose of Td every 10 years.  Varicella vaccine.** / Consult your health care provider.  Zoster vaccine.** / 1 dose for adults aged 61 years or older.  Pneumococcal 13-valent conjugate (PCV13) vaccine.** / Consult your health care provider.  Pneumococcal  polysaccharide (PPSV23) vaccine.** / 1 dose for all adults aged 28 years and older.  Meningococcal vaccine.** / Consult your health care provider.  Hepatitis A vaccine.** / Consult your health care provider.  Hepatitis B vaccine.** / Consult your health care provider.  Haemophilus influenzae type b (Hib) vaccine.** / Consult your health care provider. ** Family history and personal history of risk and conditions may change your health care provider's recommendations. Document Released: 02/19/2001 Document Revised: 05/10/2013 Document Reviewed: 05/21/2010 [facility] Patient Information 2015 [place], [place]. This information is not intended to replace advice given to you by your health care provider. Make sure you discuss any questions you have with your health care provider.

## 2013-11-08 NOTE — Progress Notes (Signed)
Pre visit review using our clinic review tool, if applicable. No additional management support is needed unless otherwise documented below in the visit note. 

## 2013-11-08 NOTE — Assessment & Plan Note (Addendum)
She refused flu vax and pneumovax Exam done Labs ordered Pt ed material was given

## 2013-11-08 NOTE — Progress Notes (Signed)
   Subjective:    Patient ID: Barbara Bray, female    DOB: 02/22/1990, 23 y.o.   MRN: [id number]  HPI Comments: She returns for a physical but also complains of fatigue and weight gain and wants to have some thyroid labs done.     Review of Systems  Constitutional: Positive for fatigue and unexpected weight change. Negative for fever, chills, diaphoresis, activity change and appetite change.  HENT: Negative.   Eyes: Negative.   Respiratory: Negative.  Negative for apnea, cough, choking, chest tightness, shortness of breath, wheezing and stridor.   Cardiovascular: Negative.  Negative for chest pain, palpitations and leg swelling.  Gastrointestinal: Positive for constipation. Negative for nausea, vomiting, abdominal pain, diarrhea and blood in stool.  Endocrine: Negative.   Genitourinary: Negative.  Negative for frequency, vaginal bleeding, vaginal discharge, difficulty urinating, genital sores and menstrual problem.  Musculoskeletal: Negative.  Negative for myalgias, back pain, joint swelling and arthralgias.  Skin: Negative.  Negative for rash.  Allergic/Immunologic: Negative.   Neurological: Negative.  Negative for dizziness, syncope, speech difficulty, weakness, light-headedness and numbness.  Hematological: Negative.  Negative for adenopathy. Does not bruise/bleed easily.  Psychiatric/Behavioral: Negative.        Objective:   Physical Exam  Constitutional: She is oriented to person, place, and time. She appears well-developed and well-nourished. No distress.  HENT:  Head: Normocephalic and atraumatic.  Mouth/Throat: Oropharynx is clear and moist. No oropharyngeal exudate.  Eyes: Conjunctivae are normal. Right eye exhibits no discharge. Left eye exhibits no discharge. No scleral icterus.  Neck: Normal range of motion. Neck supple. No JVD present. No tracheal deviation present. No thyromegaly present.  Cardiovascular: Normal rate, regular rhythm, normal heart sounds and intact  distal pulses.  Exam reveals no gallop and no friction rub.   No murmur heard. Pulmonary/Chest: Effort normal and breath sounds normal. No stridor. No respiratory distress. She has no wheezes. She has no rales. She exhibits no tenderness.  Abdominal: Soft. Bowel sounds are normal. She exhibits no distension and no mass. There is no tenderness. There is no rebound and no guarding.  Musculoskeletal: Normal range of motion. She exhibits no edema or tenderness.  Lymphadenopathy:    She has no cervical adenopathy.  Neurological: She is oriented to person, place, and time.  Skin: Skin is warm and dry. No rash noted. She is not diaphoretic. No erythema. No pallor.  Psychiatric: She has a normal mood and affect. Her behavior is normal. Judgment and thought content normal.  Vitals reviewed.    Lab Results  Component Value Date   WBC 7.9 08/18/2012   HGB 14.0 08/18/2012   HCT 41.5 08/18/2012   PLT 232.0 08/18/2012   GLUCOSE 106* 08/18/2012   CHOL 247* 08/18/2012   TRIG 113.0 08/18/2012   HDL 87.70 08/18/2012   LDLDIRECT 144.1 08/18/2012   ALT 8 08/18/2012   AST 17 08/18/2012   NA 138 08/18/2012   K 3.8 08/18/2012   CL 105 08/18/2012   CREATININE 0.8 08/18/2012   BUN 6 08/18/2012   CO2 23 08/18/2012   TSH 0.78 08/18/2012       Assessment & Plan:

## 2013-11-09 ENCOUNTER — Encounter: Payer: Self-pay | Admitting: Internal Medicine

## 2013-12-24 ENCOUNTER — Encounter: Payer: Self-pay | Admitting: Family Medicine

## 2013-12-24 NOTE — Progress Notes (Signed)
Error   This encounter was created in error - please disregard. 

## 2014-02-04 ENCOUNTER — Encounter: Payer: Self-pay | Admitting: Internal Medicine

## 2014-02-04 ENCOUNTER — Ambulatory Visit (INDEPENDENT_AMBULATORY_CARE_PROVIDER_SITE_OTHER): Payer: BLUE CROSS/BLUE SHIELD | Admitting: Internal Medicine

## 2014-02-04 VITALS — BP 110/64 | HR 83 | Temp 98.3°F | Resp 16 | Ht 63.0 in | Wt 144.0 lb

## 2014-02-04 DIAGNOSIS — G43009 Migraine without aura, not intractable, without status migrainosus: Secondary | ICD-10-CM

## 2014-02-04 MED ORDER — METHOCARBAMOL 500 MG PO TABS: 500.0000 mg | ORAL_TABLET | Freq: Three times a day (TID) | ORAL | Status: DC | PRN

## 2014-02-04 MED ORDER — TOPIRAMATE 25 MG PO TABS: 25.0000 mg | ORAL_TABLET | Freq: Two times a day (BID) | ORAL | Status: DC

## 2014-02-04 MED ORDER — SUMATRIPTAN-NAPROXEN SODIUM 85-500 MG PO TABS: 1.0000 | ORAL_TABLET | ORAL | Status: DC | PRN

## 2014-02-04 NOTE — Progress Notes (Signed)
Pre visit review using our clinic review tool, if applicable. No additional management support is needed unless otherwise documented below in the visit note. 

## 2014-02-04 NOTE — Progress Notes (Signed)
Subjective:    Patient ID: Barbara Bray, female    DOB: July 08, 1990, 24 y.o.   MRN: [id number]  HPI Comments: She has had HA's for over two years, previously the pain started in her neck and radiated up but for the last year the HA's have been predominantly on the left side, she has tried robaxin with some relief but tells me that the HA's are occurring more frequently, as often as three times a week.  Headache  This is a recurrent problem. The current episode started more than 1 year ago. The problem occurs intermittently. The problem has been gradually worsening. The pain is located in the left unilateral and parietal region. The pain radiates to the face. The pain quality is similar to prior headaches. The quality of the pain is described as dull, stabbing, sharp and throbbing. The pain is at a severity of 3/10. The pain is mild. Associated symptoms include nausea, phonophobia and photophobia. Pertinent negatives include no abdominal pain, abnormal behavior, anorexia, back pain, blurred vision, coughing, dizziness, drainage, ear pain, eye pain, eye redness, eye watering, facial sweating, fever, hearing loss, insomnia, loss of balance, muscle aches, neck pain, numbness, scalp tenderness, seizures, sinus pressure, sore throat, swollen glands, tingling, tinnitus, visual change, vomiting, weakness or weight loss. She has tried NSAIDs and acetaminophen for the symptoms. The treatment provided mild relief. Her past medical history is significant for migraine headaches and migraines in the family. There is no history of cancer, cluster headaches, hypertension, immunosuppression, obesity, pseudotumor cerebri, recent head traumas, sinus disease or TMJ.      Review of Systems  Constitutional: Negative.  Negative for fever, chills, weight loss, diaphoresis, appetite change and fatigue.  HENT: Negative for ear pain, hearing loss, sinus pressure, sore throat and tinnitus.   Eyes: Positive for photophobia.  Negative for blurred vision, pain and redness.  Respiratory: Negative.  Negative for cough, choking, chest tightness, shortness of breath and stridor.   Cardiovascular: Negative.  Negative for chest pain, palpitations and leg swelling.  Gastrointestinal: Positive for nausea. Negative for vomiting, abdominal pain and anorexia.  Endocrine: Negative.   Genitourinary: Negative.   Musculoskeletal: Negative.  Negative for back pain, arthralgias and neck pain.  Skin: Negative.   Allergic/Immunologic: Negative.   Neurological: Positive for headaches. Negative for dizziness, tingling, seizures, weakness, numbness and loss of balance.  Hematological: Negative.   Psychiatric/Behavioral: Negative.  Negative for confusion, sleep disturbance, dysphoric mood and agitation. The patient is not nervous/anxious and does not have insomnia.        Objective:   Physical Exam  Constitutional: She is oriented to person, place, and time. She appears well-developed and well-nourished.  Non-toxic appearance. She does not have a sickly appearance. She does not appear ill. No distress.  HENT:  Head: Normocephalic and atraumatic.  Mouth/Throat: Oropharynx is clear and moist. No oropharyngeal exudate.  Eyes: Conjunctivae and EOM are normal. Pupils are equal, round, and reactive to light. Right eye exhibits no discharge. Left eye exhibits no discharge. Right conjunctiva is not injected. Right conjunctiva has no hemorrhage. Left conjunctiva is not injected. Left conjunctiva has no hemorrhage. No scleral icterus. Right eye exhibits normal extraocular motion and no nystagmus. Left eye exhibits normal extraocular motion and no nystagmus. Right pupil is round and reactive. Left pupil is round and reactive. Pupils are equal.  Neck: Normal range of motion. Neck supple. No JVD present. No tracheal deviation present. No thyromegaly present.  Cardiovascular: Normal rate, regular rhythm, normal heart sounds and  intact distal pulses.   Exam reveals no gallop and no friction rub.   No murmur heard. Pulmonary/Chest: Effort normal and breath sounds normal. No stridor. No respiratory distress. She has no wheezes. She has no rales. She exhibits no tenderness.  Abdominal: Soft. Bowel sounds are normal. She exhibits no distension and no mass. There is no tenderness. There is no rebound.  Musculoskeletal: Normal range of motion. She exhibits no edema or tenderness.  Lymphadenopathy:    She has no cervical adenopathy.  Neurological: She is alert and oriented to person, place, and time. She has normal strength. She displays no atrophy, no tremor and normal reflexes. No cranial nerve deficit or sensory deficit. She exhibits normal muscle tone. She displays a negative Romberg sign. She displays no seizure activity. Coordination and gait normal. She displays no Babinski's sign on the right side. She displays no Babinski's sign on the left side.  Reflex Scores:      Tricep reflexes are 1+ on the right side and 1+ on the left side.      Bicep reflexes are 1+ on the right side and 1+ on the left side.      Brachioradialis reflexes are 1+ on the right side and 1+ on the left side.      Patellar reflexes are 1+ on the right side and 1+ on the left side.      Achilles reflexes are 1+ on the right side and 1+ on the left side. Skin: Skin is warm and dry. No rash noted. She is not diaphoretic. No erythema. No pallor.  Psychiatric: She has a normal mood and affect. Her behavior is normal. Judgment and thought content normal.  Vitals reviewed.    Lab Results  Component Value Date   WBC 9.7 11/08/2013   HGB 13.3 11/08/2013   HCT 39.5 11/08/2013   PLT 354.0 11/08/2013   GLUCOSE 82 11/08/2013   CHOL 289* 11/08/2013   TRIG 103.0 11/08/2013   HDL 94.00 11/08/2013   LDLDIRECT 144.1 08/18/2012   LDLCALC 174* 11/08/2013   ALT 13 11/08/2013   AST 19 11/08/2013   NA 137 11/08/2013   K 4.2 11/08/2013   CL 105 11/08/2013   CREATININE 0.7  11/08/2013   BUN 8 11/08/2013   CO2 22 11/08/2013   TSH 1.11 11/08/2013       Assessment & Plan:

## 2014-02-04 NOTE — Patient Instructions (Signed)

## 2014-02-05 NOTE — Assessment & Plan Note (Signed)
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 Her headaches have become so frequent that I think she needs a preventive treatment and will start topamax at a low dose and will increase the dose if indicated. She also needs a better abortive option for the headaches so I have asked her to try treximet.

## 2014-04-08 ENCOUNTER — Ambulatory Visit: Payer: Self-pay | Admitting: Internal Medicine

## 2014-04-08 DIAGNOSIS — Z0289 Encounter for other administrative examinations: Secondary | ICD-10-CM

## 2014-08-24 ENCOUNTER — Ambulatory Visit (INDEPENDENT_AMBULATORY_CARE_PROVIDER_SITE_OTHER): Payer: BLUE CROSS/BLUE SHIELD | Admitting: Internal Medicine

## 2014-08-24 ENCOUNTER — Encounter: Payer: Self-pay | Admitting: Internal Medicine

## 2014-08-24 ENCOUNTER — Ambulatory Visit (INDEPENDENT_AMBULATORY_CARE_PROVIDER_SITE_OTHER)
Admission: RE | Admit: 2014-08-24 | Discharge: 2014-08-24 | Disposition: A | Discharge status: Home / Self Care | Payer: BLUE CROSS/BLUE SHIELD | Source: Ambulatory Visit | Attending: Internal Medicine | Admitting: Internal Medicine

## 2014-08-24 VITALS — BP 100/60 | HR 80 | Temp 98.1°F | Resp 16 | Ht 64.0 in | Wt 144.0 lb

## 2014-08-24 DIAGNOSIS — M5441 Lumbago with sciatica, right side: Secondary | ICD-10-CM | POA: Diagnosis not present

## 2014-08-24 DIAGNOSIS — M5126 Other intervertebral disc displacement, lumbar region: Secondary | ICD-10-CM | POA: Insufficient documentation

## 2014-08-24 MED ORDER — MELOXICAM 15 MG PO TABS: 15.0000 mg | ORAL_TABLET | Freq: Every day | ORAL | Status: DC

## 2014-08-24 NOTE — Progress Notes (Signed)
Subjective:  Patient ID: Barbara Bray, female    DOB: 05-Aug-1990  Age: 24 y.o. MRN: [id number]  CC: Back Pain   HPI Barbara Bray presents for right lower back pain for over one year. The pain radiates into her right lower extremity and she notices some tingling all the way down to her right foot. She has seen a chiropractor but says he makes her pain worse.  Outpatient Prescriptions Prior to Visit  Medication Sig Dispense Refill  . Albuterol Sulfate (PROAIR RESPICLICK) 108 (90 BASE) MCG/ACT AEPB Inhale 1 puff into the lungs 4 (four) times daily as needed. 1 each 11  . drospirenone-ethinyl estradiol (YAZ,GIANVI,LORYNA) 3-0.02 MG tablet Take 1 tablet by mouth daily.    . methocarbamol (ROBAXIN) 500 MG tablet Take 1 tablet (500 mg total) by mouth every 8 (eight) hours as needed for muscle spasms. 60 tablet 2  . SUMAtriptan-naproxen (TREXIMET) 85-500 MG per tablet Take 1 tablet by mouth every 2 (two) hours as needed for migraine. 10 tablet 11  . topiramate (TOPAMAX) 25 MG tablet Take 1 tablet (25 mg total) by mouth 2 (two) times daily. 30 tablet 1   No facility-administered medications prior to visit.    ROS Review of Systems  Constitutional: Negative.  Negative for fever, chills, diaphoresis, appetite change and fatigue.  HENT: Negative.   Eyes: Negative.   Respiratory: Negative.  Negative for cough, choking, chest tightness, shortness of breath and stridor.   Cardiovascular: Negative.  Negative for chest pain, palpitations and leg swelling.  Gastrointestinal: Negative.  Negative for abdominal pain.  Endocrine: Negative.   Genitourinary: Negative.   Musculoskeletal: Positive for back pain. Negative for myalgias, joint swelling, arthralgias, gait problem, neck pain and neck stiffness.  Skin: Negative.  Negative for rash.  Allergic/Immunologic: Negative.   Neurological: Negative.  Negative for dizziness.  Hematological: Negative.  Negative for adenopathy. Does not bruise/bleed easily.    Psychiatric/Behavioral: Negative.     Objective:  BP 100/60 mmHg  Pulse 80  Temp(Src) 98.1 F (36.7 C) (Oral)  Resp 16  Ht  (1.626 m)  Wt 144 lb (65.318 kg)  BMI 24.71 kg/m2  LMP 08/11/2014  BP Readings from Last 3 Encounters:  08/24/14 100/60  02/04/14 110/64  11/08/13 112/62    Wt Readings from Last 3 Encounters:  08/24/14 144 lb (65.318 kg)  02/04/14 144 lb (65.318 kg)  11/08/13 138 lb (62.596 kg)    Physical Exam  Constitutional: She is oriented to person, place, and time. No distress.  HENT:  Mouth/Throat: Oropharynx is clear and moist. No oropharyngeal exudate.  Eyes: Conjunctivae are normal. Right eye exhibits no discharge. Left eye exhibits no discharge. No scleral icterus.  Neck: Normal range of motion. Neck supple. No JVD present. No tracheal deviation present. No thyromegaly present.  Cardiovascular: Normal rate, regular rhythm, normal heart sounds and intact distal pulses.  Exam reveals no gallop and no friction rub.   No murmur heard. Pulmonary/Chest: Effort normal and breath sounds normal. No stridor. No respiratory distress. She has no wheezes. She has no rales. She exhibits no tenderness.  Abdominal: Soft. Bowel sounds are normal. She exhibits no distension and no mass. There is no tenderness. There is no rebound and no guarding.  Musculoskeletal: Normal range of motion. She exhibits no edema or tenderness.       Lumbar back: Normal. She exhibits normal range of motion, no tenderness, no bony tenderness, no swelling, no edema, no deformity, no laceration, no pain, no spasm and  normal pulse.  Lymphadenopathy:    She has no cervical adenopathy.  Neurological: She is alert and oriented to person, place, and time. She displays abnormal reflex. She displays no atrophy and no tremor. No cranial nerve deficit or sensory deficit. She exhibits normal muscle tone. She displays a negative Romberg sign. She displays no seizure activity. Coordination and gait normal.   Reflex Scores:      Tricep reflexes are 1+ on the right side and 1+ on the left side.      Bicep reflexes are 1+ on the right side and 1+ on the left side.      Brachioradialis reflexes are 1+ on the right side and 1+ on the left side.      Patellar reflexes are 0 on the right side and 1+ on the left side.      Achilles reflexes are 0 on the right side and 0 on the left side. Neg SLR in BLE  Skin: Skin is warm and dry. No rash noted. She is not diaphoretic. No erythema. No pallor.  Psychiatric: She has a normal mood and affect. Her behavior is normal. Judgment and thought content normal.  Vitals reviewed.   Lab Results  Component Value Date   WBC 9.7 11/08/2013   HGB 13.3 11/08/2013   HCT 39.5 11/08/2013   PLT 354.0 11/08/2013   GLUCOSE 82 11/08/2013   CHOL 289* 11/08/2013   TRIG 103.0 11/08/2013   HDL 94.00 11/08/2013   LDLDIRECT 144.1 08/18/2012   LDLCALC 174* 11/08/2013   ALT 13 11/08/2013   AST 19 11/08/2013   NA 137 11/08/2013   K 4.2 11/08/2013   CL 105 11/08/2013   CREATININE 0.7 11/08/2013   BUN 8 11/08/2013   CO2 22 11/08/2013   TSH 1.11 11/08/2013    Dg Lumbar Spine Complete  08/24/2014   CLINICAL DATA:  Right lower back pain for 1 year  EXAM: LUMBAR SPINE - COMPLETE 4+ VIEW  COMPARISON:  None.  FINDINGS: There is no evidence of lumbar spine fracture. Alignment is normal. Intervertebral disc spaces are maintained.  IMPRESSION: Negative.   Electronically Signed   By: Natasha Mead M.D.   On: 08/24/2014 15:22    Assessment & Plan:   Barbara Bray was seen today for back pain.  Diagnoses and all orders for this visit:  Right-sided low back pain with right-sided sciatica- plain films are normal, she has radicular pain into the right lower extremity with tingling and an abnormal reflex in her right lower extremity. I will get an MRI to see if she has a disc herniation, nerve impingement or tumor causing her symptoms. She will start physical therapy for symptom relief and  will start meloxicam for relief from her pain. -     MR Lumbar Spine Wo Contrast; Future -     DG Lumbar Spine Complete; Future -     Ambulatory referral to Physical Therapy -     meloxicam (MOBIC) 15 MG tablet; Take 1 tablet (15 mg total) by mouth daily.   I have discontinued Ms. Horace's SUMAtriptan-naproxen and topiramate. I am also having her start on meloxicam. Additionally, I am having her maintain her drospirenone-ethinyl estradiol, Albuterol Sulfate, and methocarbamol.  Meds ordered this encounter  Medications  . meloxicam (MOBIC) 15 MG tablet    Sig: Take 1 tablet (15 mg total) by mouth daily.    Dispense:  90 tablet    Refill:  1     Follow-up: Return in about 2  months (around 10/24/2014).  Sanda Linger, MD

## 2014-08-24 NOTE — Progress Notes (Signed)
Pre visit review using our clinic review tool, if applicable. No additional management support is needed unless otherwise documented below in the visit note. 

## 2014-08-24 NOTE — Patient Instructions (Signed)
Back Pain, Adult Low back pain is very common. About 1 in 5 people have back pain.The cause of low back pain is rarely dangerous. The pain often gets better over time.About half of people with a sudden onset of back pain feel better in just 2 weeks. About 8 in 10 people feel better by 6 weeks.  CAUSES Some common causes of back pain include:  Strain of the muscles or ligaments supporting the spine.  Wear and tear (degeneration) of the spinal discs.  Arthritis.  Direct injury to the back. DIAGNOSIS Most of the time, the direct cause of low back pain is not known.However, back pain can be treated effectively even when the exact cause of the pain is unknown.Answering your caregiver's questions about your overall health and symptoms is one of the most accurate ways to make sure the cause of your pain is not dangerous. If your caregiver needs more information, he or she may order lab work or imaging tests (X-rays or MRIs).However, even if imaging tests show changes in your back, this usually does not require surgery. HOME CARE INSTRUCTIONS For many people, back pain returns.Since low back pain is rarely dangerous, it is often a condition that people can learn to manageon their own.   Remain active. It is stressful on the back to sit or stand in one place. Do not sit, drive, or stand in one place for more than 30 minutes at a time. Take short walks on level surfaces as soon as pain allows.Try to increase the length of time you walk each day.  Do not stay in bed.Resting more than 1 or 2 days can delay your recovery.  Do not avoid exercise or work.Your body is made to move.It is not dangerous to be active, even though your back may hurt.Your back will likely heal faster if you return to being active before your pain is gone.  Pay attention to your body when you bend and lift. Many people have less discomfortwhen lifting if they bend their knees, keep the load close to their bodies,and  avoid twisting. Often, the most comfortable positions are those that put less stress on your recovering back.  Find a comfortable position to sleep. Use a firm mattress and lie on your side with your knees slightly bent. If you lie on your back, put a pillow under your knees.  Only take over-the-counter or prescription medicines as directed by your caregiver. Over-the-counter medicines to reduce pain and inflammation are often the most helpful.Your caregiver may prescribe muscle relaxant drugs.These medicines help dull your pain so you can more quickly return to your normal activities and healthy exercise.  Put ice on the injured area.  Put ice in a plastic bag.  Place a towel between your skin and the bag.  Leave the ice on for 15-20 minutes, 03-04 times a day for the first 2 to 3 days. After that, ice and heat may be alternated to reduce pain and spasms.  Ask your caregiver about trying back exercises and gentle massage. This may be of some benefit.  Avoid feeling anxious or stressed.Stress increases muscle tension and can worsen back pain.It is important to recognize when you are anxious or stressed and learn ways to manage it.Exercise is a great option. SEEK MEDICAL CARE IF:  You have pain that is not relieved with rest or medicine.  You have pain that does not improve in 1 week.  You have new symptoms.  You are generally not feeling well. SEEK   IMMEDIATE MEDICAL CARE IF:   You have pain that radiates from your back into your legs.  You develop new bowel or bladder control problems.  You have unusual weakness or numbness in your arms or legs.  You develop nausea or vomiting.  You develop abdominal pain.  You feel faint. Document Released: 12/24/2004 Document Revised: 06/25/2011 Document Reviewed: 04/27/2013 ExitCare Patient Information 2015 [employer]. This information is not intended to replace advice given to you by your health care provider. Make sure you  discuss any questions you have with your health care provider.  

## 2014-09-11 ENCOUNTER — Other Ambulatory Visit: Payer: Self-pay

## 2014-09-27 ENCOUNTER — Ambulatory Visit: Payer: BLUE CROSS/BLUE SHIELD | Attending: Internal Medicine | Admitting: Physical Therapy

## 2014-10-07 ENCOUNTER — Other Ambulatory Visit: Payer: Self-pay

## 2014-10-08 ENCOUNTER — Ambulatory Visit
Admission: RE | Admit: 2014-10-08 | Discharge: 2014-10-08 | Disposition: A | Discharge status: Home / Self Care | Payer: BLUE CROSS/BLUE SHIELD | Source: Ambulatory Visit | Attending: Internal Medicine | Admitting: Internal Medicine

## 2014-10-08 DIAGNOSIS — M5441 Lumbago with sciatica, right side: Secondary | ICD-10-CM

## 2014-10-10 ENCOUNTER — Telehealth: Payer: Self-pay

## 2014-10-10 NOTE — Telephone Encounter (Signed)
PT calling for results on MRI done on 10/1. Please advise. Dr. Yetta Barre out of office.

## 2014-10-10 NOTE — Telephone Encounter (Signed)
Please call and let her know that there is a slight disc bulge in the low back (L4-L5) which should not be causing significant problems, this will likely resolve in time. No other problems in the back.

## 2014-10-11 NOTE — Telephone Encounter (Signed)
Pt informed. She states she still has a lot of pain in her back. Heat does help she wants to know if there is some alternative to the robaxin and mobic she is taking. She feels it does not help much. Informed Dr. Yetta Barre is out of office and will wait until he returns for further advise.

## 2014-10-12 ENCOUNTER — Other Ambulatory Visit: Payer: Self-pay | Admitting: Internal Medicine

## 2014-10-13 ENCOUNTER — Telehealth: Payer: Self-pay

## 2014-10-13 DIAGNOSIS — M5441 Lumbago with sciatica, right side: Secondary | ICD-10-CM

## 2014-10-13 NOTE — Telephone Encounter (Signed)
Robaxin rx that was originally sent to [facility] in [place] has now been [facility] to [facility] [address] per patient request-----i left message on voicemail for pharmacist to send over to [facility] at [address]

## 2014-10-13 NOTE — Telephone Encounter (Signed)
Pt called to check on this request, pt want it to go to [facility] (does not want it to go to [facility] in [place]). Please help, pt is out of this med and she is aware that Dr. Yetta Barre it out of town.

## 2014-10-15 ENCOUNTER — Encounter: Payer: Self-pay | Admitting: Internal Medicine

## 2014-10-18 MED ORDER — METHOCARBAMOL 500 MG PO TABS: 500.0000 mg | ORAL_TABLET | Freq: Four times a day (QID) | ORAL | Status: DC | PRN

## 2014-10-18 NOTE — Telephone Encounter (Signed)
Phone note from 10/3. I spoke to pt and she stated the robaxin was not working so therefore I did not route the med. She understood Dr. Yetta Barre was out of the office and he would be back on Monday. Please advise

## 2014-10-18 NOTE — Telephone Encounter (Signed)
Rx sent to new pharmacy with a dose increase Her MRI shows a herniated disc so I have also sent a referral pain management

## 2014-10-18 NOTE — Telephone Encounter (Signed)
Patient wants the prescription for Robaxin sent to [facility] on [address] in [place].  Also, I let her know there were no refills on this and she states Dr. Yetta Barre usually puts 2 or 3 refills on this.  Can you make sure she gets this prescription today

## 2014-10-19 NOTE — Telephone Encounter (Signed)
Pt informed

## 2014-10-26 ENCOUNTER — Encounter: Payer: Self-pay | Admitting: Internal Medicine

## 2014-10-26 ENCOUNTER — Ambulatory Visit (INDEPENDENT_AMBULATORY_CARE_PROVIDER_SITE_OTHER): Payer: BLUE CROSS/BLUE SHIELD | Admitting: Internal Medicine

## 2014-10-26 VITALS — BP 118/80 | HR 84 | Temp 97.9°F | Resp 16 | Ht 64.0 in | Wt 146.0 lb

## 2014-10-26 DIAGNOSIS — M5126 Other intervertebral disc displacement, lumbar region: Secondary | ICD-10-CM

## 2014-10-26 DIAGNOSIS — M5441 Lumbago with sciatica, right side: Secondary | ICD-10-CM | POA: Diagnosis not present

## 2014-10-26 MED ORDER — MELOXICAM 15 MG PO TABS: 15.0000 mg | ORAL_TABLET | Freq: Every day | ORAL | Status: DC

## 2014-10-26 NOTE — Patient Instructions (Signed)

## 2014-10-26 NOTE — Progress Notes (Signed)
Pre visit review using our clinic review tool, if applicable. No additional management support is needed unless otherwise documented below in the visit note. 

## 2014-10-27 NOTE — Progress Notes (Signed)
Subjective:  Patient ID: Barbara Bray, female    DOB: October 12, 1990  Age: 24 y.o. MRN: [id number]  CC: Back Pain   HPI [name] presents for recurrent episodes of right-sided lower back pain. She says the back pain radiates into her right thigh. She does not experience any numbness, weakness, or tingling in her legs or feet. She has been taking Mobic and a muscle relaxer with some symptom relief. Her MRI showed that she had a bulging disc at L4/L5 with some abutment against the nerve at L4. She is willing to see pain management and to start physical therapy.  Outpatient Prescriptions Prior to Visit  Medication Sig Dispense Refill  . Albuterol Sulfate (PROAIR RESPICLICK) 108 (90 BASE) MCG/ACT AEPB Inhale 1 puff into the lungs 4 (four) times daily as needed. 1 each 11  . drospirenone-ethinyl estradiol (YAZ,GIANVI,LORYNA) 3-0.02 MG tablet Take 1 tablet by mouth daily.    . methocarbamol (ROBAXIN) 500 MG tablet Take 1 tablet (500 mg total) by mouth every 6 (six) hours as needed for muscle spasms. 120 tablet 2  . meloxicam (MOBIC) 15 MG tablet Take 1 tablet (15 mg total) by mouth daily. (Patient not taking: Reported on 10/26/2014) 90 tablet 1   No facility-administered medications prior to visit.    ROS Review of Systems  Constitutional: Negative.  Negative for fever, chills and fatigue.  HENT: Negative.   Eyes: Negative.   Respiratory: Negative.  Negative for cough, choking, chest tightness, shortness of breath and stridor.   Cardiovascular: Negative.  Negative for chest pain, palpitations and leg swelling.  Gastrointestinal: Negative.   Endocrine: Negative.   Genitourinary: Negative.   Musculoskeletal: Positive for back pain. Negative for myalgias, arthralgias, gait problem and neck pain.  Allergic/Immunologic: Negative.   Neurological: Negative.  Negative for weakness and numbness.  Hematological: Negative.  Negative for adenopathy. Does not bruise/bleed easily.    Psychiatric/Behavioral: Negative.     Objective:  BP 118/80 mmHg  Pulse 84  Temp(Src) 97.9 F (36.6 C) (Oral)  Resp 16  Ht  (1.626 m)  Wt 146 lb (66.225 kg)  BMI 25.05 kg/m2  SpO2 96%  LMP 10/20/2014  BP Readings from Last 3 Encounters:  10/26/14 118/80  08/24/14 100/60  02/04/14 110/64    Wt Readings from Last 3 Encounters:  10/26/14 146 lb (66.225 kg)  08/24/14 144 lb (65.318 kg)  02/04/14 144 lb (65.318 kg)    Physical Exam  Constitutional: No distress.  HENT:  Mouth/Throat: Oropharynx is clear and moist. No oropharyngeal exudate.  Eyes: Conjunctivae are normal. Right eye exhibits no discharge. Left eye exhibits no discharge. No scleral icterus.  Neck: Normal range of motion. Neck supple. No JVD present. No tracheal deviation present. No thyromegaly present.  Cardiovascular: Normal rate, regular rhythm, normal heart sounds and intact distal pulses.  Exam reveals no gallop and no friction rub.   No murmur heard. Pulmonary/Chest: Effort normal and breath sounds normal. No stridor. No respiratory distress. She has no wheezes. She has no rales. She exhibits no tenderness.  Abdominal: Soft. Bowel sounds are normal. She exhibits no distension and no mass. There is no tenderness. There is no rebound and no guarding.  Musculoskeletal: Normal range of motion. She exhibits no edema or tenderness.       Lumbar back: She exhibits normal range of motion, no tenderness, no bony tenderness, no swelling, no edema, no deformity, no laceration, no pain, no spasm and normal pulse.  Lymphadenopathy:    She has  no cervical adenopathy.  Neurological: She is alert. She has normal strength. She displays no atrophy, no tremor and normal reflexes. No cranial nerve deficit or sensory deficit. She exhibits normal muscle tone. She displays a negative Romberg sign. She displays no seizure activity. Coordination and gait normal.  Reflex Scores:      Tricep reflexes are 1+ on the right side and  1+ on the left side.      Bicep reflexes are 1+ on the right side and 1+ on the left side.      Brachioradialis reflexes are 1+ on the right side and 1+ on the left side.      Patellar reflexes are 1+ on the right side and 1+ on the left side.      Achilles reflexes are 1+ on the right side and 1+ on the left side. Neg SLR in BLE  Skin: Skin is warm and dry. No rash noted. She is not diaphoretic. No erythema. No pallor.  Vitals reviewed.   Lab Results  Component Value Date   WBC 9.7 11/08/2013   HGB 13.3 11/08/2013   HCT 39.5 11/08/2013   PLT 354.0 11/08/2013   GLUCOSE 82 11/08/2013   CHOL 289* 11/08/2013   TRIG 103.0 11/08/2013   HDL 94.00 11/08/2013   LDLDIRECT 144.1 08/18/2012   LDLCALC 174* 11/08/2013   ALT 13 11/08/2013   AST 19 11/08/2013   NA 137 11/08/2013   K 4.2 11/08/2013   CL 105 11/08/2013   CREATININE 0.7 11/08/2013   BUN 8 11/08/2013   CO2 22 11/08/2013   TSH 1.11 11/08/2013    Mr Lumbar Spine Wo Contrast  10/08/2014  CLINICAL DATA:  Right-sided low back pain with right-sided sciatica EXAM: MRI LUMBAR SPINE WITHOUT CONTRAST TECHNIQUE: Multiplanar, multisequence MR imaging of the lumbar spine was performed. No intravenous contrast was administered. COMPARISON:  None. FINDINGS: The vertebral bodies of the lumbar spine are normal in size. The vertebral bodies of the lumbar spine are normal in alignment. There is normal bone marrow signal demonstrated throughout the vertebra. The intervertebral disc spaces are well-maintained. The spinal cord is normal in signal and contour. The cord terminates normally at T12 . The nerve roots of the cauda equina and the filum terminale are normal. The visualized portions of the SI joints are unremarkable. The imaged intra-abdominal contents are unremarkable. T12-L1: No significant disc bulge. No evidence of neural foraminal stenosis. No central canal stenosis. L1-L2: No significant disc bulge. No evidence of neural foraminal stenosis.  No central canal stenosis. L2-L3: No significant disc bulge. No evidence of neural foraminal stenosis. No central canal stenosis. L3-L4: No significant disc bulge. No evidence of neural foraminal stenosis. No central canal stenosis. L4-L5: Mild broad-based disc bulge with a a right lateral small disc protrusion abutting the right L4 nerve root. No evidence of neural foraminal stenosis. No central canal stenosis. L5-S1: No significant disc bulge. No evidence of neural foraminal stenosis. No central canal stenosis. IMPRESSION: 1. At L4-5 there is a mild broad-based disc bulge with a right lateral small disc protrusion abutting the right L4 nerve root. Electronically Signed   By: Elige KoHetal  Patel   On: 10/08/2014 15:20    Assessment & Plan:   Skeet SimmerHilary was seen today for back pain.  Diagnoses and all orders for this visit:  Herniated lumbar disc without myelopathy -     Ambulatory referral to Physical Therapy -     meloxicam (MOBIC) 15 MG tablet; Take 1 tablet (15 mg  total) by mouth daily. -     Ambulatory referral to Pain Clinic  Right-sided low back pain with right-sided sciatica   I am having Ms. Darty maintain her drospirenone-ethinyl estradiol, Albuterol Sulfate, methocarbamol, and meloxicam.  Meds ordered this encounter  Medications  . meloxicam (MOBIC) 15 MG tablet    Sig: Take 1 tablet (15 mg total) by mouth daily.    Dispense:  90 tablet    Refill:  1     Follow-up: Return in about 3 months (around 01/26/2015).  Sanda Linger, MD

## 2014-11-15 ENCOUNTER — Ambulatory Visit: Payer: BLUE CROSS/BLUE SHIELD | Attending: Internal Medicine | Admitting: Physical Therapy

## 2014-11-15 DIAGNOSIS — M545 Low back pain: Secondary | ICD-10-CM | POA: Insufficient documentation

## 2014-11-15 DIAGNOSIS — M62830 Muscle spasm of back: Secondary | ICD-10-CM | POA: Insufficient documentation

## 2014-11-15 DIAGNOSIS — M5386 Other specified dorsopathies, lumbar region: Secondary | ICD-10-CM

## 2014-11-15 DIAGNOSIS — M2560 Stiffness of unspecified joint, not elsewhere classified: Secondary | ICD-10-CM | POA: Insufficient documentation

## 2014-11-15 DIAGNOSIS — R293 Abnormal posture: Secondary | ICD-10-CM | POA: Diagnosis present

## 2014-11-15 NOTE — Patient Instructions (Signed)
   Keyonda Bickle PT, DPT, LAT, ATC  [facility] Phone: [phone]     
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
## 2014-12-06 ENCOUNTER — Ambulatory Visit: Payer: BLUE CROSS/BLUE SHIELD | Admitting: Physical Therapy

## 2014-12-13 ENCOUNTER — Ambulatory Visit: Payer: BLUE CROSS/BLUE SHIELD | Attending: Internal Medicine | Admitting: Physical Therapy

## 2014-12-20 ENCOUNTER — Ambulatory Visit: Payer: BLUE CROSS/BLUE SHIELD | Admitting: Physical Therapy

## 2014-12-27 ENCOUNTER — Ambulatory Visit: Payer: BLUE CROSS/BLUE SHIELD | Admitting: Physical Therapy

## 2015-04-06 ENCOUNTER — Encounter (INDEPENDENT_AMBULATORY_CARE_PROVIDER_SITE_OTHER): Payer: Self-pay

## 2015-05-16 ENCOUNTER — Telehealth: Payer: Self-pay | Admitting: Internal Medicine

## 2015-05-16 DIAGNOSIS — M5441 Lumbago with sciatica, right side: Secondary | ICD-10-CM

## 2015-05-16 MED ORDER — METHOCARBAMOL 500 MG PO TABS: 500.0000 mg | ORAL_TABLET | Freq: Four times a day (QID) | ORAL | Status: DC | PRN

## 2015-05-16 NOTE — Telephone Encounter (Signed)
Please advise in dr jones absence, thanks 

## 2015-05-16 NOTE — Telephone Encounter (Signed)
Patient is out of methocarbomol.  I have scheduled her an appointment for 5/23.  She would like to know if one of the other providers would refill enough of this medication to get her through until that appointment.  Please follow up with the patient in regards.

## 2015-05-16 NOTE — Telephone Encounter (Signed)
Medication refilled

## 2015-05-30 ENCOUNTER — Encounter: Payer: Self-pay | Admitting: Internal Medicine

## 2015-05-30 ENCOUNTER — Ambulatory Visit (INDEPENDENT_AMBULATORY_CARE_PROVIDER_SITE_OTHER): Payer: BLUE CROSS/BLUE SHIELD | Admitting: Internal Medicine

## 2015-05-30 VITALS — BP 110/86 | HR 99 | Temp 98.1°F | Resp 16 | Ht 64.0 in | Wt 145.0 lb

## 2015-05-30 DIAGNOSIS — M5126 Other intervertebral disc displacement, lumbar region: Secondary | ICD-10-CM

## 2015-05-30 DIAGNOSIS — L859 Epidermal thickening, unspecified: Secondary | ICD-10-CM | POA: Diagnosis not present

## 2015-05-30 DIAGNOSIS — W57XXXA Bitten or stung by nonvenomous insect and other nonvenomous arthropods, initial encounter: Secondary | ICD-10-CM | POA: Diagnosis not present

## 2015-05-30 DIAGNOSIS — T148 Other injury of unspecified body region: Secondary | ICD-10-CM | POA: Diagnosis not present

## 2015-05-30 MED ORDER — TRIAMCINOLONE ACETONIDE 0.5 % EX CREA: 1.0000 "application " | TOPICAL_CREAM | Freq: Three times a day (TID) | CUTANEOUS | Status: DC

## 2015-05-30 MED ORDER — AMMONIUM LACTATE 12 % EX CREA: TOPICAL_CREAM | CUTANEOUS | Status: DC | PRN

## 2015-05-30 NOTE — Progress Notes (Signed)
Pre visit review using our clinic review tool, if applicable. No additional management support is needed unless otherwise documented below in the visit note. 

## 2015-05-30 NOTE — Progress Notes (Addendum)
Subjective:  Patient ID: Barbara Bray, female    DOB: Mar 02, 1990  Age: 25 y.o. MRN: [id number]  CC: Back Pain and Rash   HPI Barbara Bray presents for an itchy rash on her right lower extremity for 3 days. Recent episode of low back pain that is gradually getting better. In concerned about calluses over both knees. 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  Outpatient Prescriptions Prior to Visit  Medication Sig Dispense Refill  . Albuterol Sulfate (PROAIR RESPICLICK) 108 (90 BASE) MCG/ACT AEPB Inhale 1 puff into the lungs 4 (four) times daily as needed. 1 each 11  . drospirenone-ethinyl estradiol (YAZ,GIANVI,LORYNA) 3-0.02 MG tablet Take 1 tablet by mouth daily.    . methocarbamol (ROBAXIN) 500 MG tablet Take 1 tablet (500 mg total) by mouth every 6 (six) hours as needed for muscle spasms. 120 tablet 0  . meloxicam (MOBIC) 15 MG tablet Take 1 tablet (15 mg total) by mouth daily. (Patient not taking: Reported on 05/30/2015) 90 tablet 1   No facility-administered medications prior to visit.    ROS Review of Systems  Constitutional: Negative.  Negative for fever, chills and fatigue.  HENT: Negative.   Eyes: Negative.   Respiratory: Negative.  Negative for cough, choking, chest tightness, shortness of breath and stridor.   Cardiovascular: Negative.  Negative for chest pain and palpitations.  Gastrointestinal: Negative.  Negative for nausea, vomiting, abdominal pain, diarrhea and constipation.  Endocrine: Negative.   Genitourinary: Negative.   Musculoskeletal: Positive for back pain. Negative for myalgias, arthralgias and neck pain.       She had a recurrence of her right lower back pain a few weeks ago but as gotten symptom relief with Robaxin and meloxicam. The back pain briefly radiated into her right thigh but the radiation has resolved. She denies  paresthesias in her lower extremities.  Skin: Positive for rash. Negative for color change.  Allergic/Immunologic: Negative.   Neurological: Negative.  Negative for dizziness, tremors, weakness, numbness and headaches.  Hematological: Negative.  Negative for adenopathy. Does not bruise/bleed easily.  Psychiatric/Behavioral: Negative.     Objective:  BP 110/86 mmHg  Pulse 99  Temp(Src) 98.1 F (36.7 C) (Oral)  Resp 16  Ht  (1.626 m)  Wt 145 lb (65.772 kg)  BMI 24.88 kg/m2  SpO2 99%  LMP 05/16/2015  BP Readings from Last 3 Encounters:  05/30/15 110/86  10/26/14 118/80  08/24/14 100/60    Wt Readings from Last 3 Encounters:  05/30/15 145 lb (65.772 kg)  10/26/14 146 lb (66.225 kg)  08/24/14 144 lb (65.318 kg)    Physical Exam  Constitutional: No distress.  HENT:  Mouth/Throat: Oropharynx is clear and moist. No oropharyngeal exudate.  Eyes: Conjunctivae are normal. Right eye exhibits no discharge. Left eye exhibits no discharge. No scleral icterus.  Neck: Normal range of motion. Neck supple. No JVD present. No tracheal deviation present. No thyromegaly present.  Cardiovascular: Normal rate, regular rhythm, normal heart sounds and intact distal pulses.  Exam reveals no gallop and no friction rub.   No murmur heard. Pulmonary/Chest: Effort normal and breath sounds normal. No stridor. No respiratory distress. She has no wheezes. She has no rales.  Abdominal: Soft. Bowel sounds are normal. She exhibits no distension and no mass. There is no tenderness. There is no rebound and no guarding.  Musculoskeletal: Normal  range of motion. She exhibits no edema or tenderness.       Lumbar back: Normal. She exhibits normal range of motion, no tenderness, no bony tenderness, no swelling, no edema, no deformity, no laceration and no pain.  Over both anterrior tuberosities, there are symmetrical brownish macules with hyperkeratosis.  Lymphadenopathy:    She has no cervical adenopathy.    Neurological: She is alert. She has normal strength. She displays no atrophy, no tremor and normal reflexes. No cranial nerve deficit or sensory deficit. She exhibits normal muscle tone. She displays a negative Romberg sign. She displays no seizure activity. Coordination and gait normal. She displays no Babinski's sign on the right side. She displays no Babinski's sign on the left side.  Neg SLR in BLE  Skin: Skin is warm and dry. Rash noted. She is not diaphoretic. No erythema.     Vitals reviewed.   Lab Results  Component Value Date   WBC 9.7 11/08/2013   HGB 13.3 11/08/2013   HCT 39.5 11/08/2013   PLT 354.0 11/08/2013   GLUCOSE 82 11/08/2013   CHOL 289* 11/08/2013   TRIG 103.0 11/08/2013   HDL 94.00 11/08/2013   LDLDIRECT 144.1 08/18/2012   LDLCALC 174* 11/08/2013   ALT 13 11/08/2013   AST 19 11/08/2013   NA 137 11/08/2013   K 4.2 11/08/2013   CL 105 11/08/2013   CREATININE 0.7 11/08/2013   BUN 8 11/08/2013   CO2 22 11/08/2013   TSH 1.11 11/08/2013    Mr Lumbar Spine Wo Contrast  10/08/2014  CLINICAL DATA:  Right-sided low back pain with right-sided sciatica EXAM: MRI LUMBAR SPINE WITHOUT CONTRAST TECHNIQUE: Multiplanar, multisequence MR imaging of the lumbar spine was performed. No intravenous contrast was administered. COMPARISON:  None. FINDINGS: The vertebral bodies of the lumbar spine are normal in size. The vertebral bodies of the lumbar spine are normal in alignment. There is normal bone marrow signal demonstrated throughout the vertebra. The intervertebral disc spaces are well-maintained. The spinal cord is normal in signal and contour. The cord terminates normally at T12 . The nerve roots of the cauda equina and the filum terminale are normal. The visualized portions of the SI joints are unremarkable. The imaged intra-abdominal contents are unremarkable. T12-L1: No significant disc bulge. No evidence of neural foraminal stenosis. No central canal stenosis. L1-L2: No  significant disc bulge. No evidence of neural foraminal stenosis. No central canal stenosis. L2-L3: No significant disc bulge. No evidence of neural foraminal stenosis. No central canal stenosis. L3-L4: No significant disc bulge. No evidence of neural foraminal stenosis. No central canal stenosis. L4-L5: Mild broad-based disc bulge with a a right lateral small disc protrusion abutting the right L4 nerve root. No evidence of neural foraminal stenosis. No central canal stenosis. L5-S1: No significant disc bulge. No evidence of neural foraminal stenosis. No central canal stenosis. IMPRESSION: 1. At L4-5 there is a mild broad-based disc bulge with a right lateral small disc protrusion abutting the right L4 nerve root. Electronically Signed   By: Elige KoHetal  Patel   On: 10/08/2014 15:20    Assessment & Plan:   Skeet SimmerHilary was seen today for back pain and rash.  Diagnoses and all orders for this visit:  Herniated lumbar disc without myelopathy- there is no evidence of radiculopathy at this time, she is improving with meloxicam and a muscle relaxer, will continue.  Bug bites- these appear to be and bites or mosquito bites, will treat with a topical steroid cream. -  triamcinolone cream (KENALOG) 0.5 %; Apply 1 application topically 3 (three) times daily.  Hyperkeratosis of skin- I think she may get symptom relief with a topical application of ammonium lactate -     ammonium lactate (AMLACTIN) 12 % cream; Apply topically as needed for dry skin.  Other orders -     Cancel: Pneumococcal polysaccharide vaccine 23-valent greater than or equal to 2yo subcutaneous/IM  I have discontinued Ms. Casados's meloxicam. I am also having her start on triamcinolone cream and ammonium lactate. Additionally, I am having her maintain her drospirenone-ethinyl estradiol, Albuterol Sulfate, and methocarbamol.  Meds ordered this encounter  Medications  . triamcinolone cream (KENALOG) 0.5 %    Sig: Apply 1 application topically 3  (three) times daily.    Dispense:  30 g    Refill:  1  . ammonium lactate (AMLACTIN) 12 % cream    Sig: Apply topically as needed for dry skin.    Dispense:  385 g    Refill:  1     Follow-up: Return in about 6 weeks (around 07/11/2015).  Sanda Linger, MD

## 2015-05-30 NOTE — Patient Instructions (Signed)

## 2015-08-07 ENCOUNTER — Encounter: Payer: Self-pay | Admitting: Internal Medicine

## 2015-08-07 ENCOUNTER — Other Ambulatory Visit: Payer: Self-pay | Admitting: Family

## 2015-08-07 DIAGNOSIS — M5441 Lumbago with sciatica, right side: Secondary | ICD-10-CM

## 2015-08-08 ENCOUNTER — Other Ambulatory Visit: Payer: Self-pay | Admitting: Internal Medicine

## 2015-08-08 DIAGNOSIS — M5126 Other intervertebral disc displacement, lumbar region: Secondary | ICD-10-CM

## 2015-08-08 DIAGNOSIS — M5441 Lumbago with sciatica, right side: Secondary | ICD-10-CM

## 2015-08-08 MED ORDER — METHOCARBAMOL 500 MG PO TABS: 500.0000 mg | ORAL_TABLET | Freq: Four times a day (QID) | ORAL | 0 refills | Status: DC | PRN

## 2015-09-22 ENCOUNTER — Encounter: Payer: Self-pay | Admitting: Physical Medicine & Rehabilitation

## 2015-09-22 ENCOUNTER — Encounter
Payer: BLUE CROSS/BLUE SHIELD | Attending: Physical Medicine & Rehabilitation | Admitting: Physical Medicine & Rehabilitation

## 2015-09-22 VITALS — BP 105/72 | HR 94

## 2015-09-22 DIAGNOSIS — M609 Myositis, unspecified: Secondary | ICD-10-CM | POA: Diagnosis not present

## 2015-09-22 DIAGNOSIS — M791 Myalgia: Secondary | ICD-10-CM | POA: Diagnosis not present

## 2015-09-22 DIAGNOSIS — M5126 Other intervertebral disc displacement, lumbar region: Secondary | ICD-10-CM | POA: Diagnosis not present

## 2015-09-22 DIAGNOSIS — F419 Anxiety disorder, unspecified: Secondary | ICD-10-CM | POA: Diagnosis not present

## 2015-09-22 DIAGNOSIS — Z818 Family history of other mental and behavioral disorders: Secondary | ICD-10-CM | POA: Insufficient documentation

## 2015-09-22 DIAGNOSIS — E282 Polycystic ovarian syndrome: Secondary | ICD-10-CM | POA: Insufficient documentation

## 2015-09-22 DIAGNOSIS — IMO0001 Reserved for inherently not codable concepts without codable children: Secondary | ICD-10-CM

## 2015-09-22 DIAGNOSIS — G894 Chronic pain syndrome: Secondary | ICD-10-CM | POA: Insufficient documentation

## 2015-09-22 MED ORDER — METHOCARBAMOL 500 MG PO TABS: 500.0000 mg | ORAL_TABLET | Freq: Every day | ORAL | 2 refills | Status: DC | PRN
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
## 2015-09-29 ENCOUNTER — Encounter (HOSPITAL_BASED_OUTPATIENT_CLINIC_OR_DEPARTMENT_OTHER): Payer: BLUE CROSS/BLUE SHIELD | Admitting: Physical Medicine & Rehabilitation

## 2015-09-29 ENCOUNTER — Encounter: Payer: Self-pay | Admitting: Physical Medicine & Rehabilitation

## 2015-09-29 VITALS — BP 111/76 | HR 111

## 2015-09-29 DIAGNOSIS — M791 Myalgia: Secondary | ICD-10-CM | POA: Diagnosis not present

## 2015-09-29 DIAGNOSIS — G894 Chronic pain syndrome: Secondary | ICD-10-CM

## 2015-09-29 DIAGNOSIS — M609 Myositis, unspecified: Secondary | ICD-10-CM | POA: Diagnosis not present

## 2015-09-29 DIAGNOSIS — M5126 Other intervertebral disc displacement, lumbar region: Secondary | ICD-10-CM

## 2015-09-29 DIAGNOSIS — IMO0001 Reserved for inherently not codable concepts without codable children: Secondary | ICD-10-CM

## 2015-09-29 MED ORDER — METHOCARBAMOL 750 MG PO TABS: 750.0000 mg | ORAL_TABLET | Freq: Every day | ORAL | 1 refills | Status: DC | PRN

## 2015-09-29 MED ORDER — ESOMEPRAZOLE MAGNESIUM 20 MG PO CPDR
20.0000 mg | DELAYED_RELEASE_CAPSULE | Freq: Every day | ORAL | 1 refills | Status: DC
Start: 2015-09-29 — End: 2015-12-08

## 2015-09-29 MED ORDER — MELOXICAM 15 MG PO TABS: 15.0000 mg | ORAL_TABLET | Freq: Every day | ORAL | 1 refills | Status: DC

## 2015-09-29 NOTE — Progress Notes (Signed)
Trigger point injection procedure note: Trigger Point Injection: Written consent was obtained for the patient. Trigger points identified right gluteal muscles. The areas were cleaned with alcohol, the trigger points were injected with 1 cc of 0.5% Marcaine (x4). Needle draw back was performed. There were no complications from the procedure, and it was well tolerated.   Pt also prescribed Mobic 15mg  daily with food PPI prescribed

## 2015-11-16 ENCOUNTER — Encounter: Payer: BLUE CROSS/BLUE SHIELD | Admitting: Physical Medicine & Rehabilitation

## 2015-11-23 ENCOUNTER — Ambulatory Visit: Payer: BLUE CROSS/BLUE SHIELD | Admitting: Physical Medicine & Rehabilitation

## 2015-11-24 ENCOUNTER — Ambulatory Visit: Payer: BLUE CROSS/BLUE SHIELD | Admitting: Physical Medicine & Rehabilitation

## 2015-12-08 ENCOUNTER — Encounter
Payer: BLUE CROSS/BLUE SHIELD | Attending: Physical Medicine & Rehabilitation | Admitting: Physical Medicine & Rehabilitation

## 2015-12-08 ENCOUNTER — Encounter: Payer: Self-pay | Admitting: Physical Medicine & Rehabilitation

## 2015-12-08 VITALS — BP 116/81 | HR 96 | Resp 16

## 2015-12-08 DIAGNOSIS — M791 Myalgia, unspecified site: Secondary | ICD-10-CM

## 2015-12-08 DIAGNOSIS — M5126 Other intervertebral disc displacement, lumbar region: Secondary | ICD-10-CM | POA: Diagnosis not present

## 2015-12-08 DIAGNOSIS — F419 Anxiety disorder, unspecified: Secondary | ICD-10-CM | POA: Insufficient documentation

## 2015-12-08 DIAGNOSIS — E282 Polycystic ovarian syndrome: Secondary | ICD-10-CM | POA: Insufficient documentation

## 2015-12-08 DIAGNOSIS — Z818 Family history of other mental and behavioral disorders: Secondary | ICD-10-CM | POA: Insufficient documentation

## 2015-12-08 DIAGNOSIS — G894 Chronic pain syndrome: Secondary | ICD-10-CM | POA: Diagnosis not present

## 2015-12-08 DIAGNOSIS — G479 Sleep disorder, unspecified: Secondary | ICD-10-CM | POA: Diagnosis not present

## 2015-12-08 MED ORDER — DULOXETINE HCL 30 MG PO CPEP: 30.0000 mg | ORAL_CAPSULE | Freq: Every day | ORAL | 1 refills | Status: DC

## 2015-12-08 MED ORDER — MELOXICAM 15 MG PO TABS: 15.0000 mg | ORAL_TABLET | Freq: Every day | ORAL | 1 refills | Status: DC

## 2015-12-08 MED ORDER — ESOMEPRAZOLE MAGNESIUM 20 MG PO CPDR: 20.0000 mg | DELAYED_RELEASE_CAPSULE | Freq: Every day | ORAL | 1 refills | Status: AC

## 2015-12-08 MED ORDER — TIZANIDINE HCL 2 MG PO CAPS: 2.0000 mg | ORAL_CAPSULE | Freq: Every evening | ORAL | 1 refills | Status: AC | PRN
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
## 2015-12-15 ENCOUNTER — Ambulatory Visit: Payer: BLUE CROSS/BLUE SHIELD | Admitting: Physical Medicine & Rehabilitation

## 2016-01-19 ENCOUNTER — Encounter: Payer: BLUE CROSS/BLUE SHIELD | Admitting: Physical Medicine & Rehabilitation

## 2016-02-02 ENCOUNTER — Encounter: Payer: BLUE CROSS/BLUE SHIELD | Admitting: Physical Medicine & Rehabilitation

## 2016-02-15 ENCOUNTER — Encounter
Payer: BLUE CROSS/BLUE SHIELD | Attending: Physical Medicine & Rehabilitation | Admitting: Physical Medicine & Rehabilitation

## 2016-02-15 ENCOUNTER — Encounter: Payer: Self-pay | Admitting: Physical Medicine & Rehabilitation

## 2016-02-15 VITALS — BP 108/73 | HR 116

## 2016-02-15 DIAGNOSIS — M791 Myalgia, unspecified site: Secondary | ICD-10-CM

## 2016-02-15 DIAGNOSIS — Z818 Family history of other mental and behavioral disorders: Secondary | ICD-10-CM | POA: Insufficient documentation

## 2016-02-15 DIAGNOSIS — G894 Chronic pain syndrome: Secondary | ICD-10-CM | POA: Diagnosis not present

## 2016-02-15 DIAGNOSIS — E282 Polycystic ovarian syndrome: Secondary | ICD-10-CM | POA: Diagnosis not present

## 2016-02-15 DIAGNOSIS — M5126 Other intervertebral disc displacement, lumbar region: Secondary | ICD-10-CM | POA: Insufficient documentation

## 2016-02-15 DIAGNOSIS — F419 Anxiety disorder, unspecified: Secondary | ICD-10-CM | POA: Diagnosis not present

## 2016-02-15 MED ORDER — MELOXICAM 15 MG PO TABS: 15.0000 mg | ORAL_TABLET | Freq: Every day | ORAL | 1 refills | Status: DC

## 2016-02-15 MED ORDER — GABAPENTIN 100 MG PO CAPS
100.0000 mg | ORAL_CAPSULE | Freq: Every day | ORAL | 1 refills | Status: DC
Start: 2016-02-15 — End: 2016-05-09

## 2016-02-15 NOTE — Addendum Note (Signed)
Addended by: Maryla MorrowPATEL, Joelee Snoke A on: 02/15/2016 02:26 PM   Modules accepted: Orders
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
## 2016-04-17 ENCOUNTER — Other Ambulatory Visit: Payer: Self-pay

## 2016-04-17 ENCOUNTER — Telehealth: Payer: Self-pay | Admitting: Physical Medicine & Rehabilitation

## 2016-04-17 MED ORDER — MELOXICAM 15 MG PO TABS: 15.0000 mg | ORAL_TABLET | Freq: Every day | ORAL | 3 refills | Status: DC

## 2016-04-17 NOTE — Telephone Encounter (Signed)
FYI  Patient states you gave her a couple of months on the Meloxicam- return visit 6 months.  She states she has been out about 10 days and definitely feels better when she takes it - [place] sent a refill to pharmacy

## 2016-04-17 NOTE — Telephone Encounter (Signed)
Thanks

## 2016-04-22 ENCOUNTER — Other Ambulatory Visit: Payer: Self-pay | Admitting: Physical Medicine & Rehabilitation

## 2016-05-09 ENCOUNTER — Encounter
Payer: BLUE CROSS/BLUE SHIELD | Attending: Physical Medicine & Rehabilitation | Admitting: Physical Medicine & Rehabilitation

## 2016-05-09 ENCOUNTER — Encounter: Payer: Self-pay | Admitting: Physical Medicine & Rehabilitation

## 2016-05-09 VITALS — BP 122/79 | HR 87

## 2016-05-09 DIAGNOSIS — M792 Neuralgia and neuritis, unspecified: Secondary | ICD-10-CM

## 2016-05-09 DIAGNOSIS — F419 Anxiety disorder, unspecified: Secondary | ICD-10-CM | POA: Diagnosis not present

## 2016-05-09 DIAGNOSIS — G894 Chronic pain syndrome: Secondary | ICD-10-CM | POA: Insufficient documentation

## 2016-05-09 DIAGNOSIS — M791 Myalgia, unspecified site: Secondary | ICD-10-CM

## 2016-05-09 DIAGNOSIS — M898X1 Other specified disorders of bone, shoulder: Secondary | ICD-10-CM | POA: Diagnosis not present

## 2016-05-09 DIAGNOSIS — M5126 Other intervertebral disc displacement, lumbar region: Secondary | ICD-10-CM | POA: Diagnosis not present

## 2016-05-09 DIAGNOSIS — M62838 Other muscle spasm: Secondary | ICD-10-CM

## 2016-05-09 DIAGNOSIS — E282 Polycystic ovarian syndrome: Secondary | ICD-10-CM | POA: Diagnosis not present

## 2016-05-09 DIAGNOSIS — Z818 Family history of other mental and behavioral disorders: Secondary | ICD-10-CM | POA: Insufficient documentation

## 2016-05-09 MED ORDER — LIDOCAINE 5 % EX OINT: 1.0000 "application " | TOPICAL_OINTMENT | CUTANEOUS | 0 refills | Status: DC | PRN

## 2016-05-09 MED ORDER — DICLOFENAC SODIUM 1 % TD GEL: 2.0000 g | Freq: Four times a day (QID) | TRANSDERMAL | 1 refills | Status: DC
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
## 2016-05-09 NOTE — Addendum Note (Signed)
Addended by: Maryla MorrowPATEL, Tenya Araque A on: 05/09/2016 03:30 PM   Modules accepted: Orders

## 2016-05-24 ENCOUNTER — Encounter: Payer: Self-pay | Admitting: Physical Medicine & Rehabilitation

## 2016-06-13 ENCOUNTER — Encounter: Payer: Self-pay | Admitting: Family

## 2016-06-13 ENCOUNTER — Ambulatory Visit
Admission: RE | Admit: 2016-06-13 | Discharge: 2016-06-13 | Disposition: A | Discharge status: Home / Self Care | Payer: BLUE CROSS/BLUE SHIELD | Source: Ambulatory Visit | Attending: Family | Admitting: Family

## 2016-06-13 ENCOUNTER — Other Ambulatory Visit (INDEPENDENT_AMBULATORY_CARE_PROVIDER_SITE_OTHER): Payer: BLUE CROSS/BLUE SHIELD

## 2016-06-13 ENCOUNTER — Ambulatory Visit (INDEPENDENT_AMBULATORY_CARE_PROVIDER_SITE_OTHER): Payer: BLUE CROSS/BLUE SHIELD | Admitting: Family

## 2016-06-13 DIAGNOSIS — T887XXA Unspecified adverse effect of drug or medicament, initial encounter: Secondary | ICD-10-CM

## 2016-06-13 DIAGNOSIS — T50905A Adverse effect of unspecified drugs, medicaments and biological substances, initial encounter: Secondary | ICD-10-CM

## 2016-06-13 DIAGNOSIS — R1031 Right lower quadrant pain: Secondary | ICD-10-CM

## 2016-06-13 LAB — BASIC METABOLIC PANEL WITH GFR
BUN: 9 mg/dL (ref 6–23)
CO2: 26 meq/L (ref 19–32)
Calcium: 9.6 mg/dL (ref 8.4–10.5)
Chloride: 104 meq/L (ref 96–112)
Creatinine, Ser: 0.75 mg/dL (ref 0.40–1.20)
GFR: 99.48 mL/min
Glucose, Bld: 124 mg/dL — ABNORMAL HIGH (ref 70–99)
Potassium: 4.1 meq/L (ref 3.5–5.1)
Sodium: 138 meq/L (ref 135–145)

## 2016-06-13 NOTE — Telephone Encounter (Signed)
Patient is requesting a call about her labs. I informed her of the notes. She still wants to speak with the nurse.
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
## 2016-06-13 NOTE — Assessment & Plan Note (Signed)
New-onset right lower quadrant pain with concern for appendicitis or ovarian cyst. Does have positive McBurney's point and rebound tenderness. Does have history of PCOS. Obtain CT scan to rule out appendicitis.

## 2016-06-13 NOTE — Patient Instructions (Signed)
Thank you for choosing [facility].  SUMMARY AND INSTRUCTIONS:  There is concern for possible appendicitis or ovarian cyst causing your pain.   We will schedule you CT scan.   Labs:  Please stop by the lab on the lower level of the building for your blood work. Your results will be released to MyChart (or called to you) after review, usually within 72 hours after test completion. If any changes need to be made, you will be notified at that same time.  1.) The lab is open from 7:30am to 5:30 pm Monday-Friday 2.) No appointment is necessary 3.) Fasting (if needed) is 6-8 hours after food and drink; black coffee and water are okay   Follow up:  If your symptoms worsen or fail to improve, please contact our office for further instruction, or in case of emergency go directly to the emergency room at the closest medical facility.    Appendicitis The appendix is a tube that is shaped like a finger. It is connected to the large intestine. Appendicitis means that this tube is swollen (inflamed). Without treatment, the tube can tear (rupture). This can lead to a life-threatening infection. It can also cause you to have sores (abscesses). These sores hurt. What are the causes? This condition may be caused by something that blocks the appendix, such as:  A ball of poop (stool).  Lymph glands that are bigger than normal.  Sometimes, the cause is not known. What are the signs or symptoms? Symptoms of this condition include:  Pain around the belly button (navel). ? The pain moves toward the lower right belly (abdomen). ? The pain can get worse with time. ? The pain can get worse if you cough. ? The pain can get worse if you move suddenly.  Tenderness in the lower right belly.  Feeling sick to your stomach (nauseous).  Throwing up (vomiting).  Not feeling hungry (loss of appetite).  A fever.  Having a hard time pooping (constipation).  Watery poop (diarrhea).  Not feeling  well.  How is this treated? Usually, this condition is treated by taking out the appendix (appendectomy). There are two ways that the appendix can be taken out:  Open surgery. In this surgery, the appendix is taken out through a large cut (incision). The cut is made in the lower right belly. This surgery may be picked if: ? You have scars from another surgery. ? You have a bleeding condition. ? You are pregnant and will be having your baby soon. ? You have a condition that does not allow the other type of surgery.  Laparoscopic surgery. In this surgery, the appendix is taken out through small cuts. Often, this surgery: ? Causes less pain. ? Causes fewer problems. ? Is easier to heal from.  If your appendix tears and a sore forms:  A drain may be put into the sore. The drain will be used to get rid of fluid.  You may get an antibiotic medicine through an IV tube.  Your appendix may or may not need to be taken out.  This information is not intended to replace advice given to you by your health care provider. Make sure you discuss any questions you have with your health care provider. Document Released: 03/18/2011 Document Revised: 06/01/2015 Document Reviewed: 05/11/2014 Elsevier Interactive Patient Education  2018 [employer].   Ovarian Cyst An ovarian cyst is a fluid-filled sac on an ovary. The ovaries are organs that make eggs in women. Most ovarian cysts  go away on their own and are not cancerous (are benign). Some cysts need treatment. Follow these instructions at home:  Take over-the-counter and prescription medicines only as told by your doctor.  Do not drive or use heavy machinery while taking prescription pain medicine.  Get pelvic exams and Pap tests as often as told by your doctor.  Return to your normal activities as told by your doctor. Ask your doctor what activities are safe for you.  Do not use any products that contain nicotine or tobacco, such as cigarettes  and e-cigarettes. If you need help quitting, ask your doctor.  Keep all follow-up visits as told by your doctor. This is important. Contact a doctor if:  Your periods are: ? Late. ? Irregular. ? Painful.  Your periods stop.  You have pelvic pain that does not go away.  You have pressure on your bladder.  You have trouble making your bladder empty when you pee (urinate).  You have pain during sex.  You have any of the following in your belly (abdomen): ? A feeling of fullness. ? Pressure. ? Discomfort. ? Pain that does not go away. ? Swelling.  You feel sick most of the time.  You have trouble pooping (have constipation).  You are not as hungry as usual (you lose your appetite).  You get very bad acne.  You start to have more hair on your body and face.  You are gaining weight or losing weight without changing your exercise and eating habits.  You think you may be pregnant. Get help right away if:  You have belly pain that is very bad or gets worse.  You cannot eat or drink without throwing up (vomiting).  You suddenly get a fever.  Your period is a lot heavier than usual. This information is not intended to replace advice given to you by your health care provider. Make sure you discuss any questions you have with your health care provider. Document Released: 06/12/2007 Document Revised: 07/14/2015 Document Reviewed: 05/28/2015 Elsevier Interactive Patient Education  2017 [employer]

## 2016-06-13 NOTE — Assessment & Plan Note (Signed)
Symptoms and exam consistent with possible medication reaction to over-the-counter medications. No current symptoms noted on exam and appear to be improving. No further treatment is necessary at this time. Continue to monitor.

## 2016-06-21 ENCOUNTER — Ambulatory Visit: Payer: BLUE CROSS/BLUE SHIELD | Admitting: Physical Medicine & Rehabilitation

## 2016-07-02 ENCOUNTER — Other Ambulatory Visit (HOSPITAL_COMMUNITY)
Admission: RE | Admit: 2016-07-02 | Discharge: 2016-07-02 | Disposition: A | Discharge status: Home / Self Care | Payer: BLUE CROSS/BLUE SHIELD | Source: Ambulatory Visit | Attending: Nurse Practitioner | Admitting: Nurse Practitioner

## 2016-07-02 ENCOUNTER — Other Ambulatory Visit: Payer: Self-pay | Admitting: Nurse Practitioner

## 2016-07-02 DIAGNOSIS — Z124 Encounter for screening for malignant neoplasm of cervix: Secondary | ICD-10-CM | POA: Diagnosis present

## 2016-07-05 LAB — CYTOLOGY - PAP
CHLAMYDIA, DNA PROBE: NEGATIVE
Diagnosis: NEGATIVE
NEISSERIA GONORRHEA: NEGATIVE

## 2016-07-18 ENCOUNTER — Encounter: Payer: Self-pay | Admitting: Physical Therapy

## 2016-07-18 ENCOUNTER — Ambulatory Visit: Payer: BLUE CROSS/BLUE SHIELD | Attending: Nurse Practitioner | Admitting: Physical Therapy

## 2016-07-18 DIAGNOSIS — G8929 Other chronic pain: Secondary | ICD-10-CM | POA: Insufficient documentation

## 2016-07-18 DIAGNOSIS — M6281 Muscle weakness (generalized): Secondary | ICD-10-CM | POA: Diagnosis present

## 2016-07-18 DIAGNOSIS — M62838 Other muscle spasm: Secondary | ICD-10-CM | POA: Diagnosis present

## 2016-07-18 DIAGNOSIS — M545 Low back pain: Secondary | ICD-10-CM | POA: Diagnosis present

## 2016-07-18 NOTE — Patient Instructions (Signed)
About Abdominal Massage  Abdominal massage, also called external colon massage, is a self-treatment circular massage technique that can reduce and eliminate gas and ease constipation. The colon naturally contracts in waves in a clockwise direction starting from inside the right hip, moving up toward the ribs, across the belly, and down inside the left hip.  When you perform circular abdominal massage, you help stimulate your colon's normal wave pattern of movement called peristalsis.  It is most beneficial when done after eating.  Positioning You can practice abdominal massage with oil while lying down, or in the shower with soap.  Some people find that it is just as effective to do the massage through clothing while sitting or standing.  How to Massage Start by placing your finger tips or knuckles on your right side, just inside your hip bone.  . Make small circular movements while you move upward toward your rib cage.   . Once you reach the bottom right side of your rib cage, take your circular movements across to the left side of the bottom of your rib cage.  . Next, move downward until you reach the inside of your left hip bone.  This is the path your feces travel in your colon. . Continue to perform your abdominal massage in this pattern for 10 minutes each day.     You can apply as much pressure as is comfortable in your massage.  Start gently and build pressure as you continue to practice.  Notice any areas of pain as you massage; areas of slight pain may be relieved as you massage, but if you have areas of significant or intense pain, consult with your healthcare provider.  Other Considerations . General physical activity including bending and stretching can have a beneficial massage-like effect on the colon.  Deep breathing can also stimulate the colon because breathing deeply activates the same nervous system that supplies the colon.   . Abdominal massage should always be used in  combination with a bowel-conscious diet that is high in the proper type of fiber for you, fluids (primarily water), and a regular exercise program.   Self massage to pelvic floor Press around labia, move tissue up and down and side to side When you find a tender spot try to hold to your level of comfort and wait 10 sec, if pain reduces or dissipates, press in further or move in small side to side and up and down motions Place finger in vaginal opening and pull in all directions
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
## 2016-07-25 ENCOUNTER — Encounter: Payer: BLUE CROSS/BLUE SHIELD | Admitting: Internal Medicine

## 2016-07-25 ENCOUNTER — Encounter: Payer: BLUE CROSS/BLUE SHIELD | Admitting: Physical Therapy

## 2016-08-01 ENCOUNTER — Ambulatory Visit: Payer: BLUE CROSS/BLUE SHIELD | Admitting: Physical Therapy

## 2016-08-01 ENCOUNTER — Encounter: Payer: Self-pay | Admitting: Physical Therapy

## 2016-08-01 DIAGNOSIS — M545 Low back pain: Secondary | ICD-10-CM

## 2016-08-01 DIAGNOSIS — M62838 Other muscle spasm: Secondary | ICD-10-CM

## 2016-08-01 DIAGNOSIS — G8929 Other chronic pain: Secondary | ICD-10-CM

## 2016-08-01 DIAGNOSIS — M6281 Muscle weakness (generalized): Secondary | ICD-10-CM
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
## 2016-08-08 ENCOUNTER — Ambulatory Visit (INDEPENDENT_AMBULATORY_CARE_PROVIDER_SITE_OTHER): Payer: BLUE CROSS/BLUE SHIELD | Admitting: Internal Medicine

## 2016-08-08 DIAGNOSIS — Z0000 Encounter for general adult medical examination without abnormal findings: Secondary | ICD-10-CM

## 2016-08-08 NOTE — Progress Notes (Signed)
   Subjective:  Patient ID: Barbara Bray, female    DOB: 06/17/1990  Age: 26 y.o. MRN: [id number]  CC: No chief complaint on file.   HPI [facility]ray [facility]  Outpatient Medications Prior to Visit  Medication Sig Dispense Refill  . Albuterol Sulfate (PROAIR RESPICLICK) 108 (90 BASE) MCG/ACT AEPB Inhale 1 puff into the lungs 4 (four) times daily as needed. 1 each 11  . ammonium lactate (AMLACTIN) 12 % cream Apply topically as needed for dry skin. 385 g 1  . diclofenac sodium (VOLTAREN) 1 % GEL Apply 2 g topically 4 (four) times daily. 1 Tube 1  . drospirenone-ethinyl estradiol (YAZ,GIANVI,LORYNA) 3-0.02 MG tablet Take 1 tablet by mouth daily.    Marland Kitchen. esomeprazole (NEXIUM) 20 MG capsule Take 1 capsule (20 mg total) by mouth daily at 12 noon. 30 capsule 1  . lidocaine (XYLOCAINE) 5 % ointment Apply 1 application topically as needed. 35.44 g 0  . meloxicam (MOBIC) 15 MG tablet Take 1 tablet (15 mg total) by mouth daily. 30 tablet 3  . methocarbamol (ROBAXIN) 500 MG tablet Take 500 mg by mouth 4 (four) times daily.     No facility-administered medications prior to visit.     ROS Review of Systems  Objective:  There were no vitals taken for this visit.  BP Readings from Last 3 Encounters:  06/13/16 118/82  05/09/16 122/79  02/15/16 108/73    Wt Readings from Last 3 Encounters:  06/13/16 143 lb (64.9 kg)  05/30/15 145 lb (65.8 kg)  10/26/14 146 lb (66.2 kg)    Physical Exam  Lab Results  Component Value Date   WBC 9.7 11/08/2013   HGB 13.3 11/08/2013   HCT 39.5 11/08/2013   PLT 354.0 11/08/2013   GLUCOSE 124 (H) 06/13/2016   CHOL 289 (H) 11/08/2013   TRIG 103.0 11/08/2013   HDL 94.00 11/08/2013   LDLDIRECT 144.1 08/18/2012   LDLCALC 174 (H) 11/08/2013   ALT 13 11/08/2013   AST 19 11/08/2013   NA 138 06/13/2016   K 4.1 06/13/2016   CL 104 06/13/2016   CREATININE 0.75 06/13/2016   BUN 9 06/13/2016   CO2 26 06/13/2016   TSH 1.11 11/08/2013    No results  found.  Assessment & Plan:   There are no diagnoses linked to this encounter. I am having Ms. Lesiak maintain her drospirenone-ethinyl estradiol, Albuterol Sulfate, ammonium lactate, esomeprazole, meloxicam, diclofenac sodium, lidocaine, and methocarbamol.  No orders of the defined types were placed in this encounter.    Follow-up: No Follow-up on file.  Sanda Lingerhomas Jones, MD

## 2016-08-09 ENCOUNTER — Ambulatory Visit: Payer: BLUE CROSS/BLUE SHIELD | Attending: Nurse Practitioner | Admitting: Physical Therapy

## 2016-08-09 ENCOUNTER — Encounter: Payer: Self-pay | Admitting: Physical Therapy

## 2016-08-09 DIAGNOSIS — M62830 Muscle spasm of back: Secondary | ICD-10-CM | POA: Insufficient documentation

## 2016-08-09 DIAGNOSIS — G8929 Other chronic pain: Secondary | ICD-10-CM

## 2016-08-09 DIAGNOSIS — M62838 Other muscle spasm: Secondary | ICD-10-CM | POA: Diagnosis not present

## 2016-08-09 DIAGNOSIS — M6281 Muscle weakness (generalized): Secondary | ICD-10-CM | POA: Insufficient documentation

## 2016-08-09 DIAGNOSIS — M545 Low back pain: Secondary | ICD-10-CM | POA: Diagnosis present
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
## 2016-08-15 ENCOUNTER — Encounter: Payer: BLUE CROSS/BLUE SHIELD | Admitting: Physical Medicine & Rehabilitation

## 2016-08-16 ENCOUNTER — Encounter: Payer: Self-pay | Admitting: Physical Therapy

## 2016-08-16 ENCOUNTER — Ambulatory Visit: Payer: BLUE CROSS/BLUE SHIELD | Admitting: Physical Therapy

## 2016-08-16 DIAGNOSIS — M62830 Muscle spasm of back: Secondary | ICD-10-CM

## 2016-08-16 DIAGNOSIS — M62838 Other muscle spasm: Secondary | ICD-10-CM

## 2016-08-16 DIAGNOSIS — M6281 Muscle weakness (generalized): Secondary | ICD-10-CM

## 2016-08-16 DIAGNOSIS — M545 Low back pain: Secondary | ICD-10-CM

## 2016-08-16 DIAGNOSIS — G8929 Other chronic pain: Secondary | ICD-10-CM
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
## 2016-08-18 ENCOUNTER — Other Ambulatory Visit: Payer: Self-pay | Admitting: Physical Medicine & Rehabilitation

## 2016-08-22 ENCOUNTER — Other Ambulatory Visit (INDEPENDENT_AMBULATORY_CARE_PROVIDER_SITE_OTHER): Payer: BLUE CROSS/BLUE SHIELD

## 2016-08-22 ENCOUNTER — Ambulatory Visit (INDEPENDENT_AMBULATORY_CARE_PROVIDER_SITE_OTHER): Payer: BLUE CROSS/BLUE SHIELD | Admitting: Internal Medicine

## 2016-08-22 ENCOUNTER — Encounter: Payer: Self-pay | Admitting: Internal Medicine

## 2016-08-22 VITALS — BP 112/80 | HR 88 | Temp 98.4°F | Resp 16 | Ht 64.0 in | Wt 141.5 lb

## 2016-08-22 DIAGNOSIS — E785 Hyperlipidemia, unspecified: Secondary | ICD-10-CM | POA: Diagnosis not present

## 2016-08-22 DIAGNOSIS — R739 Hyperglycemia, unspecified: Secondary | ICD-10-CM

## 2016-08-22 DIAGNOSIS — J4530 Mild persistent asthma, uncomplicated: Secondary | ICD-10-CM | POA: Diagnosis not present

## 2016-08-22 DIAGNOSIS — E041 Nontoxic single thyroid nodule: Secondary | ICD-10-CM | POA: Insufficient documentation

## 2016-08-22 DIAGNOSIS — Z0000 Encounter for general adult medical examination without abnormal findings: Secondary | ICD-10-CM | POA: Diagnosis not present

## 2016-08-22 LAB — CBC WITH DIFFERENTIAL/PLATELET
BASOS PCT: 1.4 % (ref 0.0–3.0)
Basophils Absolute: 0.1 10*3/uL (ref 0.0–0.1)
EOS ABS: 0.2 10*3/uL (ref 0.0–0.7)
EOS PCT: 2.2 % (ref 0.0–5.0)
HEMATOCRIT: 38.3 % (ref 36.0–46.0)
HEMOGLOBIN: 13 g/dL (ref 12.0–15.0)
LYMPHS PCT: 32.5 % (ref 12.0–46.0)
Lymphs Abs: 2.4 10*3/uL (ref 0.7–4.0)
MCHC: 34 g/dL (ref 30.0–36.0)
MCV: 90 fl (ref 78.0–100.0)
MONOS PCT: 8.9 % (ref 3.0–12.0)
Monocytes Absolute: 0.7 10*3/uL (ref 0.1–1.0)
NEUTROS PCT: 55 % (ref 43.0–77.0)
Neutro Abs: 4.1 10*3/uL (ref 1.4–7.7)
Platelets: 264 10*3/uL (ref 150.0–400.0)
RBC: 4.25 Mil/uL (ref 3.87–5.11)
RDW: 12.6 % (ref 11.5–15.5)
WBC: 7.4 10*3/uL (ref 4.0–10.5)

## 2016-08-22 LAB — LIPID PANEL
CHOL/HDL RATIO: 3
CHOLESTEROL: 254 mg/dL — AB (ref 0–200)
HDL: 85.1 mg/dL (ref >39.00)
LDL CALC: 142 mg/dL — AB (ref 0–99)
NonHDL: 169.09
TRIGLYCERIDES: 133 mg/dL (ref 0.0–149.0)
VLDL: 26.6 mg/dL (ref 0.0–40.0)

## 2016-08-22 LAB — COMPREHENSIVE METABOLIC PANEL
ALBUMIN: 3.9 g/dL (ref 3.5–5.2)
ALT: 12 U/L (ref 0–35)
AST: 15 U/L (ref 0–37)
Alkaline Phosphatase: 53 U/L (ref 39–117)
BUN: 8 mg/dL (ref 6–23)
CALCIUM: 9.5 mg/dL (ref 8.4–10.5)
CO2: 24 meq/L (ref 19–32)
Chloride: 104 mEq/L (ref 96–112)
Creatinine, Ser: 0.79 mg/dL (ref 0.40–1.20)
GFR: 93.55 mL/min (ref >60.00)
Glucose, Bld: 87 mg/dL (ref 70–99)
POTASSIUM: 4 meq/L (ref 3.5–5.1)
Sodium: 136 mEq/L (ref 135–145)
Total Bilirubin: 0.4 mg/dL (ref 0.2–1.2)
Total Protein: 7.1 g/dL (ref 6.0–8.3)

## 2016-08-22 LAB — HEMOGLOBIN A1C: HEMOGLOBIN A1C: 5.2 % (ref 4.6–6.5)

## 2016-08-22 MED ORDER — ALBUTEROL SULFATE 108 (90 BASE) MCG/ACT IN AEPB: 1.0000 | INHALATION_SPRAY | Freq: Four times a day (QID) | RESPIRATORY_TRACT | 11 refills | Status: AC | PRN

## 2016-08-22 NOTE — Patient Instructions (Signed)
Thyroid Nodule A thyroid nodule is an isolatedgrowth of thyroid cells that forms a lump in your thyroid gland. The thyroid gland is a butterfly-shaped gland. It is found in the lower front of your neck. This gland sends chemical messengers (hormones) through your blood to all parts of your body. These hormones are important in regulating your body temperature and helping your body to use energy. Thyroid nodules are common. Most are not cancerous (are benign). You may have one nodule or several nodules. Different types of thyroid nodules include:  Nodules that grow and fill with fluid (thyroid cysts).  Nodules that produce too much thyroid hormone (hot nodules or hyperthyroid).  Nodules that produce no thyroid hormone (cold nodules or hypothyroid).  Nodules that form from cancer cells (thyroid cancers).  What are the causes? Usually, the cause of this condition is not known. What increases the risk? Factors that make this condition more likely to develop include:  Increasing age. Thyroid nodules become more common in people who are older than 26 years of age.  Gender. ? Benign thyroid nodules are more common in women. ? Cancerous (malignant) thyroid nodules are more common in men.  A family history that includes: ? Thyroid nodules. ? Pheochromocytoma. ? Thyroid carcinoma. ? Hyperparathyroidism.  Certain kinds of thyroid diseases, such as Hashimoto thyroiditis.  Lack of iodine.  A history of head and neck radiation, such as from X-rays.  What are the signs or symptoms? It is common for this condition to cause no symptoms. If you have symptoms, they may include:  A lump in your lower neck.  Feeling a lump or tickle in your throat.  Pain in your neck, jaw, or ear.  Having trouble swallowing.  Hot nodules may cause symptoms that include:  Weight loss.  Warm, flushed skin.  Feeling hot.  Feeling nervous.  A racing heartbeat.  Cold nodules may cause symptoms that  include:  Weight gain.  Dry skin.  Brittle hair. This may also occur with hair loss.  Feeling cold.  Fatigue.  Thyroid cancer nodules may cause symptoms that include:  Hard nodules that feel stuck to the thyroid gland.  Hoarseness.  Lumps in the glands near your thyroid (lymph nodes).  How is this diagnosed? A thyroid nodule may be felt by your health care provider during a physical exam. This condition may also be diagnosed based on your symptoms. You may also have tests, including:  An ultrasound. This may be done to confirm the diagnosis.  A biopsy. This involves taking a sample from the nodule and looking at it under a microscope to see if the nodule is benign.  Blood tests to make sure that your thyroid is working properly.  Imaging tests such as MRI or CT scan may be done if: ? Your nodule is large. ? Your nodule is blocking your airway. ? Cancer is suspected.  How is this treated? Treatment depends on the cause and size of your nodule or nodules. If the nodule is benign, treatment may not be necessary. Your health care provider may monitor the nodule to see if it goes away without treatment. If the nodule continues to grow, is cancerous, or does not go away:  It may need to be drained with a needle.  It may need to be removed with surgery.  If you have surgery, part or all of your thyroid gland may need to be removed as well. Follow these instructions at home:  Pay attention to any changes in your   nodule.  Take over-the-counter and prescription medicines only as told by your health care provider.  Keep all follow-up visits as told by your health care provider. This is important. Contact a health care provider if:  Your voice changes.  You have trouble swallowing.  You have pain in your neck, ear, or jaw that is getting worse.  Your nodule gets bigger.  Your nodule starts to make it harder for you to breathe. Get help right away if:  You have a  sudden fever.  You feel very weak.  Your muscles look like they are shrinking (muscle wasting).  You have mood swings.  You feel very restless.  You feel confused.  You are seeing or hearing things that other people do not see or hear (having hallucinations).  You feel suddenly nauseous or throw up.  You suddenly have diarrhea.  You have chest pain.  There is a loss of consciousness. This information is not intended to replace advice given to you by your health care provider. Make sure you discuss any questions you have with your health care provider. Document Released: 11/17/2003 Document Revised: 08/27/2015 Document Reviewed: 04/06/2014 Elsevier Interactive Patient Education  2018 Elsevier Inc.  

## 2016-08-22 NOTE — Progress Notes (Signed)
Subjective:  Patient ID: Barbara Bray, female    DOB: 10/25/1990  Age: 26 y.o. MRN: [id number]  CC: Asthma and Annual Exam   HPI Barbara Bray presents for a CPX.  She has rare wheezing and requests a refill of albuterol. She is not willing to use a steroid inhaler. She otherwise feels well and offers no other complaints today.  Outpatient Medications Prior to Visit  Medication Sig Dispense Refill  . drospirenone-ethinyl estradiol (YAZ,GIANVI,LORYNA) 3-0.02 MG tablet Take 1 tablet by mouth daily.    Marland Kitchen. esomeprazole (NEXIUM) 20 MG capsule Take 1 capsule (20 mg total) by mouth daily at 12 noon. 30 capsule 1  . Albuterol Sulfate (PROAIR RESPICLICK) 108 (90 BASE) MCG/ACT AEPB Inhale 1 puff into the lungs 4 (four) times daily as needed. 1 each 11  . meloxicam (MOBIC) 15 MG tablet TAKE 1 TABLET (15 MG TOTAL) BY MOUTH DAILY. 30 tablet 3  . methocarbamol (ROBAXIN) 500 MG tablet Take 500 mg by mouth 4 (four) times daily.    Marland Kitchen. ammonium lactate (AMLACTIN) 12 % cream Apply topically as needed for dry skin. 385 g 1  . diclofenac sodium (VOLTAREN) 1 % GEL Apply 2 g topically 4 (four) times daily. 1 Tube 1  . lidocaine (XYLOCAINE) 5 % ointment Apply 1 application topically as needed. 35.44 g 0   No facility-administered medications prior to visit.     ROS Review of Systems  Constitutional: Negative.  Negative for activity change, appetite change, diaphoresis, fatigue and unexpected weight change.  HENT: Negative.  Negative for sore throat, trouble swallowing and voice change.   Eyes: Negative.   Respiratory: Positive for wheezing. Negative for cough, chest tightness and shortness of breath.   Cardiovascular: Negative for chest pain, palpitations and leg swelling.  Gastrointestinal: Negative for abdominal pain, constipation, diarrhea, nausea and vomiting.  Endocrine: Negative.  Negative for cold intolerance and heat intolerance.  Genitourinary: Negative.  Negative for difficulty urinating.    Musculoskeletal: Negative.   Skin: Negative.   Allergic/Immunologic: Negative.   Neurological: Negative.  Negative for dizziness.  Hematological: Negative for adenopathy. Does not bruise/bleed easily.  Psychiatric/Behavioral: Negative.     Objective:  BP 112/80 (BP Location: Left Arm, Patient Position: Sitting, Cuff Size: Normal)   Pulse 88   Temp 98.4 F (36.9 C) (Oral)   Ht 5\' 4"  (1.626 m)   Wt 141 lb 8 oz (64.2 kg)   LMP 07/23/2016 (Within Months)   SpO2 99%   BMI 24.29 kg/m   BP Readings from Last 3 Encounters:  08/23/16 116/82  08/22/16 112/80  06/13/16 118/82    Wt Readings from Last 3 Encounters:  08/22/16 141 lb 8 oz (64.2 kg)  06/13/16 143 lb (64.9 kg)  05/30/15 145 lb (65.8 kg)    Physical Exam  Constitutional: She is oriented to person, place, and time. No distress.  HENT:  Mouth/Throat: Oropharynx is clear and moist. No oropharyngeal exudate.  Eyes: Conjunctivae are normal. Right eye exhibits no discharge. Left eye exhibits no discharge. No scleral icterus.  Neck: Normal range of motion. Neck supple. No JVD present. No tracheal deviation present. Thyroid mass and thyromegaly present.    Cardiovascular: Normal rate, regular rhythm and intact distal pulses.  Exam reveals no gallop and no friction rub.   No murmur heard. Pulmonary/Chest: Effort normal and breath sounds normal. No stridor. No respiratory distress. She has no wheezes. She has no rales. She exhibits no tenderness.  Abdominal: Soft. Bowel sounds are normal. She exhibits  no distension and no mass. There is no tenderness. There is no rebound and no guarding.  Musculoskeletal: Normal range of motion. She exhibits no edema, tenderness or deformity.  Lymphadenopathy:    She has no cervical adenopathy.  Neurological: She is alert and oriented to person, place, and time.  Skin: Skin is warm and dry. No rash noted. She is not diaphoretic. No erythema. No pallor.  Psychiatric: She has a normal mood and  affect. Her behavior is normal. Judgment and thought content normal.  Vitals reviewed.   Lab Results  Component Value Date   WBC 7.4 08/22/2016   HGB 13.0 08/22/2016   HCT 38.3 08/22/2016   PLT 264.0 08/22/2016   GLUCOSE 87 08/22/2016   CHOL 254 (H) 08/22/2016   TRIG 133.0 08/22/2016   HDL 85.10 08/22/2016   LDLDIRECT 144.1 08/18/2012   LDLCALC 142 (H) 08/22/2016   ALT 12 08/22/2016   AST 15 08/22/2016   NA 136 08/22/2016   K 4.0 08/22/2016   CL 104 08/22/2016   CREATININE 0.79 08/22/2016   BUN 8 08/22/2016   CO2 24 08/22/2016   TSH 2.14 08/22/2016   HGBA1C 5.2 08/22/2016    No results found.  Assessment & Plan:   Barbara Bray was seen today for asthma and annual exam.  Diagnoses and all orders for this visit:  Routine general medical examination at a health care facility- exam completed, labs reviewed, she refused a Pneumovax, patient education material was given. Her Pap smears up-to-date. -     Lipid panel; Future  Mild persistent asthma without complication- we'll continue albuterol inhaler. She is not willing to use an inhaled corticosteroid. -     CBC with Differential/Platelet; Future -     Albuterol Sulfate (PROAIR RESPICLICK) 108 (90 Base) MCG/ACT AEPB; Inhale 1 puff into the lungs 4 (four) times daily as needed.  Hyperglycemia- improvement noted -     Comprehensive metabolic panel; Future -     Hemoglobin A1c; Future  Hyperlipidemia with target low density lipoprotein (LDL) cholesterol less than 130 mg/dL- she is too young to be considered for statin therapy. -     Thyroid Panel With TSH; Future  Nodule of left lobe of thyroid gland- she has a very slightly elevated totalT4, a normal free thyroid index, and slightly low T3 uptake. Clinically she appears euthyroid and the TFTs are consistent with that. I've asked her to undergo an ultrasound of the thyroid nodule to see if there is concern for thyroid malignancy. -     US SOFT TISSUE HEAD AND NECK; Future   I  have discontinued Barbara Bray's ammonium lactate, diclofenac sodium, and lidocaine. I am also having her maintain her drospirenone-ethinyl estradiol, esomeprazole, and Albuterol Sulfate.  Meds ordered this encounter  Medications  . Albuterol Sulfate (PROAIR RESPICLICK) 108 (90 Base) MCG/ACT AEPB    Sig: Inhale 1 puff into the lungs 4 (four) times daily as needed.    Dispense:  1 each    Refill:  11     Follow-up: Return in about 3 weeks (around 09/12/2016).  Sanda Linger, MD

## 2016-08-23 ENCOUNTER — Encounter
Payer: BLUE CROSS/BLUE SHIELD | Attending: Physical Medicine & Rehabilitation | Admitting: Physical Medicine & Rehabilitation

## 2016-08-23 ENCOUNTER — Encounter: Payer: Self-pay | Admitting: Physical Medicine & Rehabilitation

## 2016-08-23 ENCOUNTER — Ambulatory Visit: Payer: BLUE CROSS/BLUE SHIELD | Admitting: Physical Therapy

## 2016-08-23 ENCOUNTER — Encounter: Payer: Self-pay | Admitting: Internal Medicine

## 2016-08-23 VITALS — BP 116/82 | HR 98

## 2016-08-23 DIAGNOSIS — M898X1 Other specified disorders of bone, shoulder: Secondary | ICD-10-CM | POA: Diagnosis not present

## 2016-08-23 DIAGNOSIS — G894 Chronic pain syndrome: Secondary | ICD-10-CM | POA: Diagnosis not present

## 2016-08-23 DIAGNOSIS — M5126 Other intervertebral disc displacement, lumbar region: Secondary | ICD-10-CM | POA: Insufficient documentation

## 2016-08-23 DIAGNOSIS — M79604 Pain in right leg: Secondary | ICD-10-CM | POA: Diagnosis not present

## 2016-08-23 DIAGNOSIS — F419 Anxiety disorder, unspecified: Secondary | ICD-10-CM | POA: Diagnosis not present

## 2016-08-23 DIAGNOSIS — M62838 Other muscle spasm: Secondary | ICD-10-CM

## 2016-08-23 DIAGNOSIS — E282 Polycystic ovarian syndrome: Secondary | ICD-10-CM | POA: Diagnosis not present

## 2016-08-23 DIAGNOSIS — M791 Myalgia, unspecified site: Secondary | ICD-10-CM

## 2016-08-23 DIAGNOSIS — Z818 Family history of other mental and behavioral disorders: Secondary | ICD-10-CM | POA: Insufficient documentation

## 2016-08-23 DIAGNOSIS — G479 Sleep disorder, unspecified: Secondary | ICD-10-CM

## 2016-08-23 DIAGNOSIS — M792 Neuralgia and neuritis, unspecified: Secondary | ICD-10-CM | POA: Diagnosis not present

## 2016-08-23 LAB — THYROID PANEL WITH TSH
Free Thyroxine Index: 2.3 (ref 1.4–3.8)
T3 Uptake: 19 % — ABNORMAL LOW (ref 22–35)
T4 TOTAL: 12 ug/dL — AB (ref 5.1–11.9)
TSH: 2.14 m[IU]/L

## 2016-08-23 MED ORDER — MELOXICAM 15 MG PO TABS: 15.0000 mg | ORAL_TABLET | Freq: Every day | ORAL | 2 refills | Status: DC

## 2016-08-23 MED ORDER — METHOCARBAMOL 750 MG PO TABS: 750.0000 mg | ORAL_TABLET | Freq: Two times a day (BID) | ORAL | 2 refills | Status: AC | PRN
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
## 2016-08-30 ENCOUNTER — Encounter: Payer: BLUE CROSS/BLUE SHIELD | Admitting: Physical Therapy

## 2016-09-06 ENCOUNTER — Encounter: Payer: BLUE CROSS/BLUE SHIELD | Admitting: Physical Therapy

## 2016-09-06 ENCOUNTER — Encounter: Payer: Self-pay | Admitting: Internal Medicine

## 2016-09-06 ENCOUNTER — Other Ambulatory Visit: Payer: Self-pay | Admitting: Obstetrics and Gynecology

## 2016-09-06 ENCOUNTER — Other Ambulatory Visit: Payer: Self-pay | Admitting: Internal Medicine

## 2016-09-06 ENCOUNTER — Ambulatory Visit
Admission: RE | Admit: 2016-09-06 | Discharge: 2016-09-06 | Disposition: A | Discharge status: Home / Self Care | Payer: BLUE CROSS/BLUE SHIELD | Source: Ambulatory Visit | Attending: Internal Medicine | Admitting: Internal Medicine

## 2016-09-06 DIAGNOSIS — R0989 Other specified symptoms and signs involving the circulatory and respiratory systems: Secondary | ICD-10-CM

## 2016-09-06 DIAGNOSIS — E041 Nontoxic single thyroid nodule: Secondary | ICD-10-CM

## 2016-09-10 NOTE — Telephone Encounter (Signed)
Patient called in requesting results.  Did give her MD response on email.  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  Patient is requesting follow up call in regard at [phone].

## 2016-09-12 ENCOUNTER — Other Ambulatory Visit: Payer: Self-pay | Admitting: Internal Medicine

## 2016-09-13 ENCOUNTER — Encounter: Payer: Self-pay | Admitting: Physical Medicine & Rehabilitation

## 2016-09-13 ENCOUNTER — Encounter: Payer: BLUE CROSS/BLUE SHIELD | Admitting: Physical Therapy

## 2016-09-13 ENCOUNTER — Encounter
Payer: BLUE CROSS/BLUE SHIELD | Attending: Physical Medicine & Rehabilitation | Admitting: Physical Medicine & Rehabilitation

## 2016-09-13 VITALS — BP 112/76 | HR 78 | Resp 14

## 2016-09-13 DIAGNOSIS — F419 Anxiety disorder, unspecified: Secondary | ICD-10-CM | POA: Diagnosis not present

## 2016-09-13 DIAGNOSIS — Z818 Family history of other mental and behavioral disorders: Secondary | ICD-10-CM | POA: Diagnosis not present

## 2016-09-13 DIAGNOSIS — M898X1 Other specified disorders of bone, shoulder: Secondary | ICD-10-CM | POA: Diagnosis not present

## 2016-09-13 DIAGNOSIS — M5126 Other intervertebral disc displacement, lumbar region: Secondary | ICD-10-CM | POA: Insufficient documentation

## 2016-09-13 DIAGNOSIS — G894 Chronic pain syndrome: Secondary | ICD-10-CM | POA: Insufficient documentation

## 2016-09-13 DIAGNOSIS — M791 Myalgia, unspecified site: Secondary | ICD-10-CM

## 2016-09-13 DIAGNOSIS — M792 Neuralgia and neuritis, unspecified: Secondary | ICD-10-CM

## 2016-09-13 DIAGNOSIS — M62838 Other muscle spasm: Secondary | ICD-10-CM | POA: Diagnosis not present

## 2016-09-13 DIAGNOSIS — E282 Polycystic ovarian syndrome: Secondary | ICD-10-CM | POA: Diagnosis not present

## 2016-09-13 MED ORDER — GABAPENTIN 100 MG PO CAPS: 100.0000 mg | ORAL_CAPSULE | Freq: Two times a day (BID) | ORAL | 2 refills | Status: DC

## 2016-09-13 MED ORDER — DICLOFENAC SODIUM 1 % TD GEL: 2.0000 g | Freq: Four times a day (QID) | TRANSDERMAL | 2 refills | Status: AC
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
## 2016-09-13 NOTE — Addendum Note (Signed)
Addended by: Maryla MorrowPATEL, Ciella Obi A on: 09/13/2016 11:59 AM   Modules accepted: Level of Service

## 2016-12-20 ENCOUNTER — Other Ambulatory Visit: Payer: Self-pay | Admitting: Physical Medicine & Rehabilitation

## 2017-01-02 ENCOUNTER — Other Ambulatory Visit: Payer: Self-pay | Admitting: Nurse Practitioner

## 2017-01-10 ENCOUNTER — Other Ambulatory Visit: Payer: Self-pay | Admitting: *Deleted

## 2017-01-10 MED ORDER — GABAPENTIN 100 MG PO CAPS: 100.0000 mg | ORAL_CAPSULE | Freq: Two times a day (BID) | ORAL | 0 refills | Status: AC

## 2017-05-20 IMAGING — MR MR LUMBAR SPINE W/O CM
4 of 5 series · 21 of 48 positions shown · non-contrast
Comparison: None.

CLINICAL DATA: Right-sided low back pain with right-sided sciatica

EXAM:
MRI LUMBAR SPINE WITHOUT CONTRAST
TECHNIQUE: Multiplanar, multisequence MR imaging of the lumbar spine was
performed. No intravenous contrast was administered.

[Series 6: T2 · sagittal · 4.0mm · 0.68mm/px · 6 of 14 slices shown (1 of 2)]
[im 1/14]
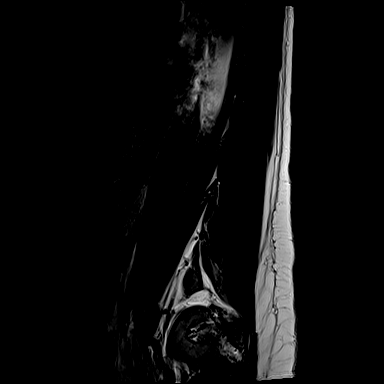
[im 3/14]
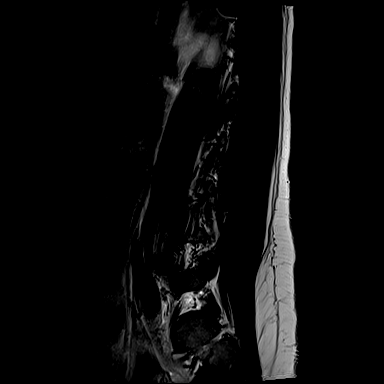
[im 6/14]
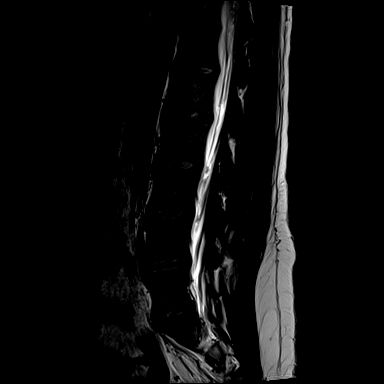
[im 8/14]
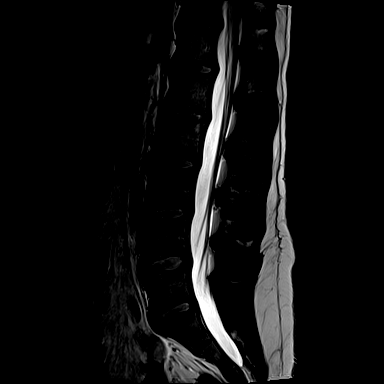
[im 11/14]
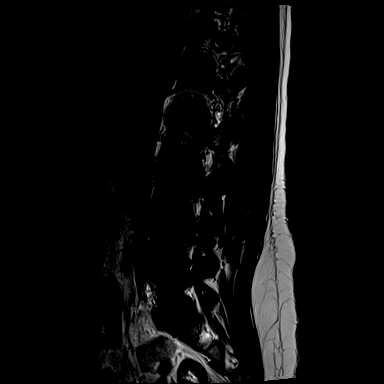
[im 14/14]
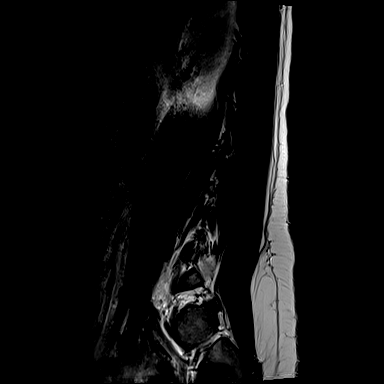

[Series 8: T1 · sagittal · 4.0mm · 0.81mm/px · 3 of 14 slices shown (1 of 2)]
[im 3/14]
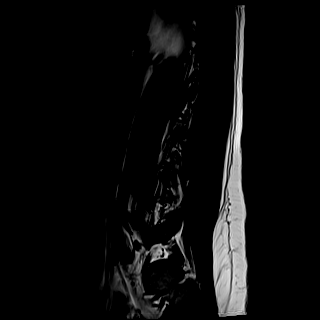
[im 8/14]
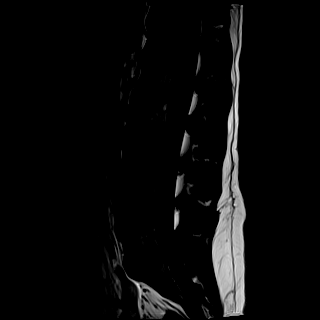
[im 14/14]
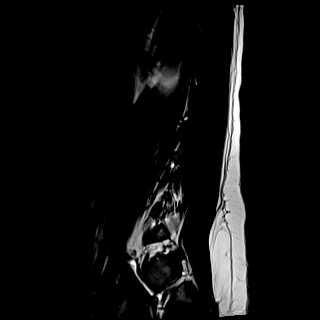

[Series 9: T2 · axial · 4.0mm · 0.28mm/px · z∈[-99,+80]mm · 9 of 34 slices shown (2 of 2)]
[im 1/34]
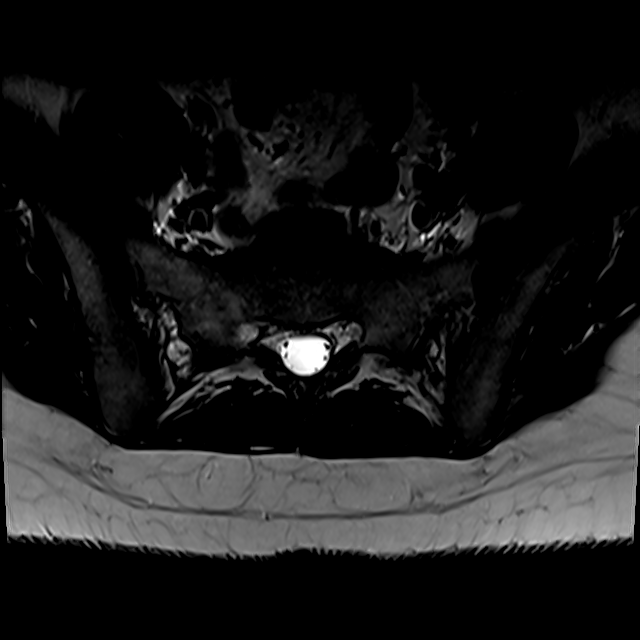
[im 5/34]
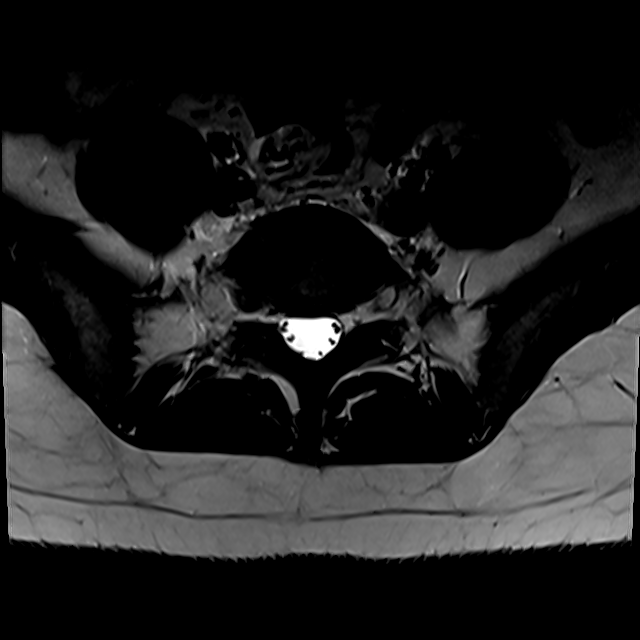
[im 10/34]
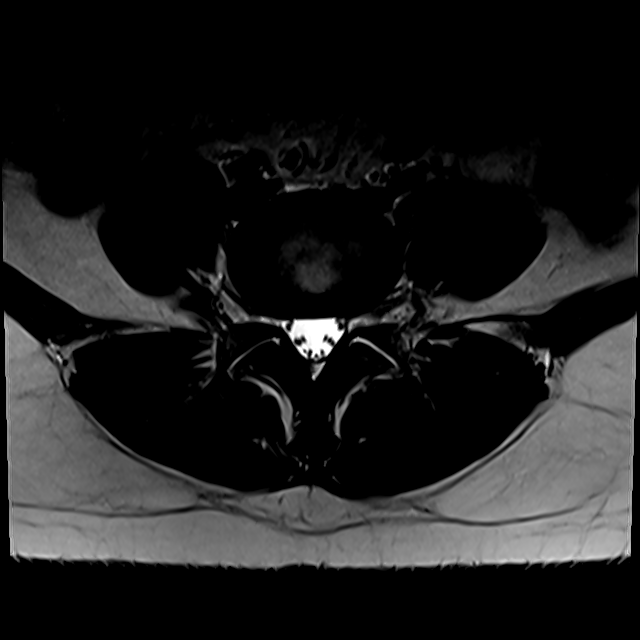
[im 15/34]
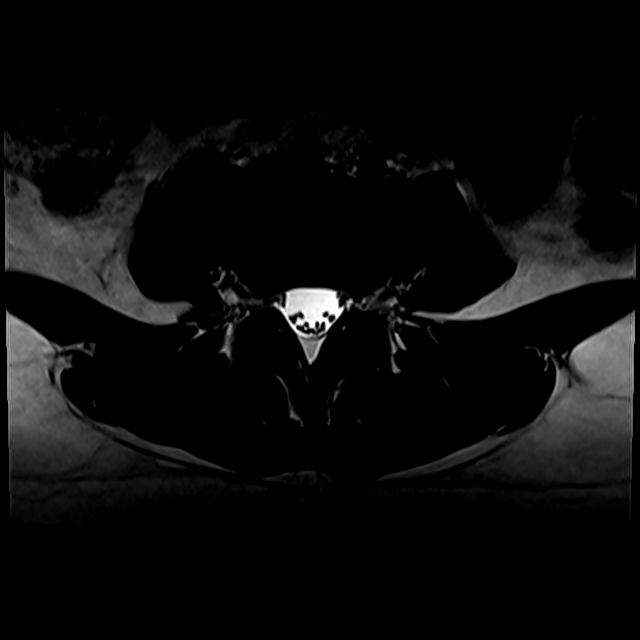
[im 17/34]
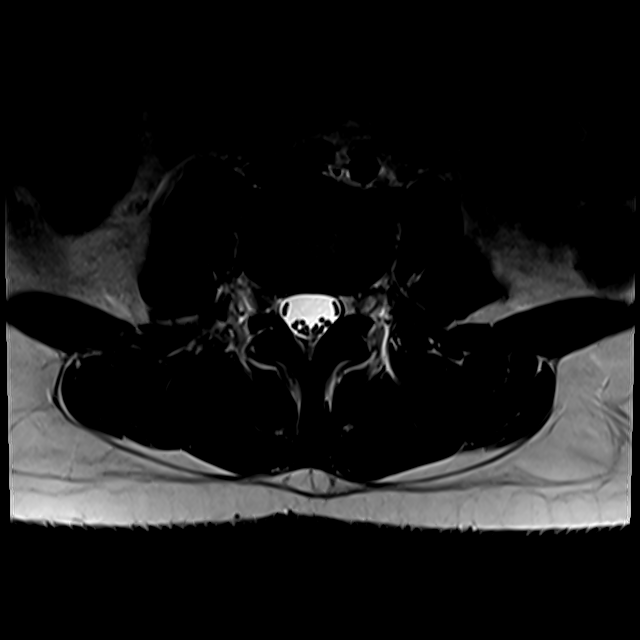
[im 19/34]
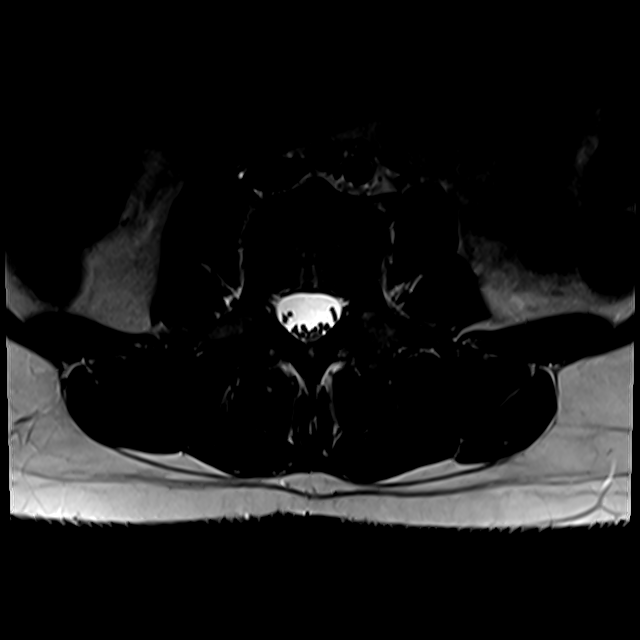
[im 24/34]
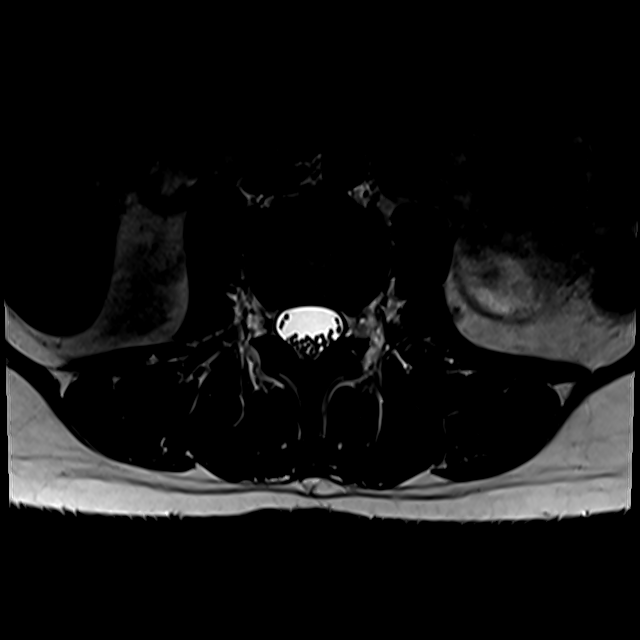
[im 29/34]
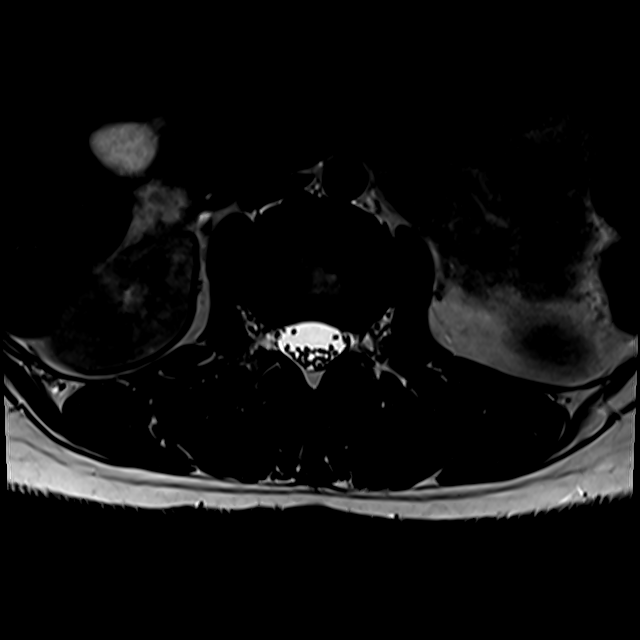
[im 34/34]
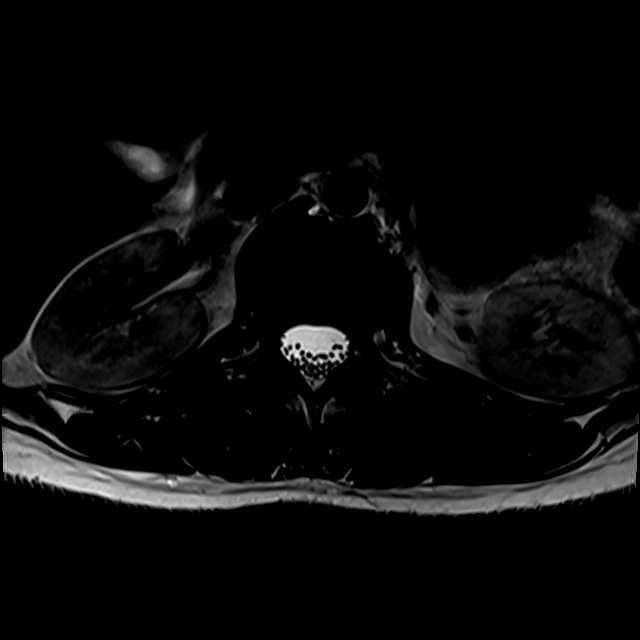

[Series 10: T1 · axial · 4.0mm · 0.59mm/px · z∈[-80,+55]mm · 3 of 34 slices shown (2 of 2)]
[im 5/34]
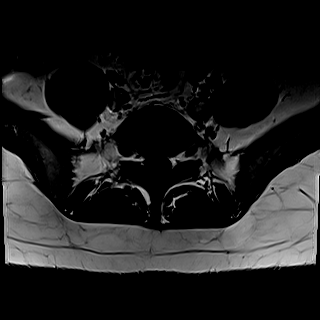
[im 17/34]
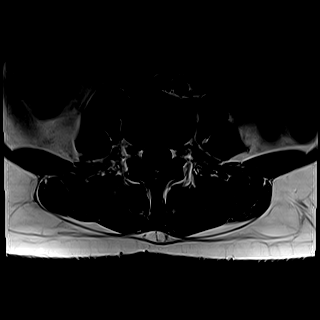
[im 29/34]
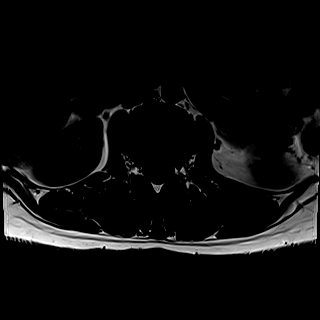

[21 of 48 positions shown; findings below may reference images not displayed]

FINDINGS: The vertebral bodies of the lumbar spine are normal in size. The
vertebral bodies of the lumbar spine are normal in alignment. There
is normal bone marrow signal demonstrated throughout the vertebra.
The intervertebral disc spaces are well-maintained.

The spinal cord is normal in signal and contour. The cord terminates
normally at T12 . The nerve roots of the cauda equina and the filum
terminale are normal.

The visualized portions of the SI joints are unremarkable.

The imaged intra-abdominal contents are unremarkable.

T12-L1: No significant disc bulge. No evidence of neural foraminal
stenosis. No central canal stenosis.

L1-L2: No significant disc bulge. No evidence of neural foraminal
stenosis. No central canal stenosis.

L2-L3: No significant disc bulge. No evidence of neural foraminal
stenosis. No central canal stenosis.

L3-L4: No significant disc bulge. No evidence of neural foraminal
stenosis. No central canal stenosis.

L4-L5: Mild broad-based disc bulge with a a right lateral small disc
protrusion abutting the right L4 nerve root. No evidence of neural
foraminal stenosis. No central canal stenosis.

L5-S1: No significant disc bulge. No evidence of neural foraminal
stenosis. No central canal stenosis.
IMPRESSION: 1. At L4-5 there is a mild broad-based disc bulge with a right
lateral small disc protrusion abutting the right L4 nerve root.

## 2017-09-02 ENCOUNTER — Other Ambulatory Visit: Payer: Self-pay | Admitting: Internal Medicine

## 2017-09-02 DIAGNOSIS — E041 Nontoxic single thyroid nodule: Secondary | ICD-10-CM

## 2017-09-04 NOTE — Addendum Note (Signed)
Addended by: Verlan FriendsAIRRIKIER DAVIDSON, Zilphia Kozinski M on: 09/04/2017 02:01 PM   Modules accepted: Orders

## 2017-09-06 ENCOUNTER — Encounter: Payer: Self-pay | Admitting: Internal Medicine

## 2017-12-21 ENCOUNTER — Other Ambulatory Visit: Payer: Self-pay | Admitting: Internal Medicine

## 2017-12-21 DIAGNOSIS — E041 Nontoxic single thyroid nodule: Secondary | ICD-10-CM

## 2018-01-06 ENCOUNTER — Encounter: Payer: Self-pay | Admitting: Internal Medicine

## 2018-01-20 NOTE — Telephone Encounter (Signed)
Pt declined the [place] due to not having [employer] as this time.

## 2019-03-06 ENCOUNTER — Ambulatory Visit: Payer: Self-pay | Attending: Internal Medicine

## 2019-03-06 DIAGNOSIS — Z23 Encounter for immunization: Secondary | ICD-10-CM | POA: Insufficient documentation

## 2019-03-06 NOTE — Progress Notes (Signed)
   Covid-19 Vaccination Clinic  Name:  Leyani Gargus    MRN: [id number] DOB: September 30, 1990  03/06/2019  Ms. Hassinger was observed post Covid-19 immunization for 15 minutes without incidence. She was provided with Vaccine Information Sheet and instruction to access the V-Safe system.   Ms. Biggar was instructed to call 911 with any severe reactions post vaccine: Marland Kitchen Difficulty breathing  . Swelling of your face and throat  . A fast heartbeat  . A bad rash all over your body  . Dizziness and weakness    Immunizations Administered    Name Date Dose VIS Date Route   Pfizer COVID-19 Vaccine 03/06/2019 10:39 AM 0.3 mL 12/18/2018 Intramuscular   Manufacturer: [employer], [employer]   Lot: [id number]   NDC: [address]-7

## 2019-03-27 ENCOUNTER — Ambulatory Visit: Payer: Self-pay | Attending: Internal Medicine

## 2019-03-27 DIAGNOSIS — Z23 Encounter for immunization: Secondary | ICD-10-CM

## 2019-03-27 NOTE — Progress Notes (Signed)
   Covid-19 Vaccination Clinic  Name:  Barbara Bray    MRN: [id number] DOB: 06/06/1990  03/27/2019  [name] was observed post Covid-19 immunization for 15 minutes without incident. She was provided with Vaccine Information Sheet and instruction to access the V-Safe system.   [name] was instructed to call 911 with any severe reactions post vaccine: Marland Kitchen Difficulty breathing  . Swelling of face and throat  . A fast heartbeat  . A bad rash all over body  . Dizziness and weakness   Immunizations Administered    Name Date Dose VIS Date Route   Pfizer COVID-19 Vaccine 03/27/2019 12:08 PM 0.3 mL 12/18/2018 Intramuscular   Manufacturer: [employer], [employer]   Lot: [id number]   NDC: [address]-0

## 2019-03-31 ENCOUNTER — Ambulatory Visit: Payer: Self-pay
